# Patient Record
Sex: Female | Born: 1980 | Race: White | Hispanic: Yes | Marital: Married | Smoking: Never smoker
Health system: Southern US, Community
[De-identification: ages and names within clinical notes are randomized; demographics above are authoritative.]

## PROBLEM LIST (undated history)

## (undated) DIAGNOSIS — R7303 Prediabetes: Secondary | ICD-10-CM

## (undated) DIAGNOSIS — T7840XA Allergy, unspecified, initial encounter: Secondary | ICD-10-CM

## (undated) DIAGNOSIS — M3500 Sicca syndrome, unspecified: Secondary | ICD-10-CM

## (undated) DIAGNOSIS — D649 Anemia, unspecified: Secondary | ICD-10-CM

## (undated) DIAGNOSIS — M069 Rheumatoid arthritis, unspecified: Secondary | ICD-10-CM

## (undated) HISTORY — DX: Anemia, unspecified: D64.9

## (undated) HISTORY — DX: Prediabetes: R73.03

## (undated) HISTORY — DX: Allergy, unspecified, initial encounter: T78.40XA

## (undated) HISTORY — DX: Rheumatoid arthritis, unspecified: M06.9

## (undated) HISTORY — DX: Sicca syndrome, unspecified: M35.00

---
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
## 2002-05-26 ENCOUNTER — Encounter (INDEPENDENT_AMBULATORY_CARE_PROVIDER_SITE_OTHER): Payer: Self-pay | Admitting: Specialist

## 2002-05-26 ENCOUNTER — Ambulatory Visit (HOSPITAL_COMMUNITY): Admission: AD | Admit: 2002-05-26 | Discharge: 2002-05-26 | Payer: Self-pay | Admitting: *Deleted

## 2003-04-13 ENCOUNTER — Emergency Department (HOSPITAL_COMMUNITY): Admission: EM | Admit: 2003-04-13 | Discharge: 2003-04-14 | Payer: Self-pay | Admitting: *Deleted

## 2003-08-21 ENCOUNTER — Other Ambulatory Visit: Admission: RE | Admit: 2003-08-21 | Discharge: 2003-08-21 | Payer: Self-pay | Admitting: Obstetrics & Gynecology

## 2004-02-11 ENCOUNTER — Ambulatory Visit: Payer: Self-pay | Admitting: Family Medicine

## 2004-02-11 ENCOUNTER — Inpatient Hospital Stay (HOSPITAL_COMMUNITY): Admission: AD | Admit: 2004-02-11 | Discharge: 2004-02-11 | Payer: Self-pay | Admitting: *Deleted

## 2004-02-25 ENCOUNTER — Inpatient Hospital Stay (HOSPITAL_COMMUNITY): Admission: AD | Admit: 2004-02-25 | Discharge: 2004-02-25 | Payer: Self-pay | Admitting: *Deleted

## 2004-02-28 ENCOUNTER — Inpatient Hospital Stay (HOSPITAL_COMMUNITY): Admission: AD | Admit: 2004-02-28 | Discharge: 2004-02-28 | Payer: Self-pay | Admitting: Obstetrics and Gynecology

## 2004-02-29 ENCOUNTER — Inpatient Hospital Stay (HOSPITAL_COMMUNITY): Admission: AD | Admit: 2004-02-29 | Discharge: 2004-03-03 | Payer: Self-pay | Admitting: Family Medicine

## 2005-01-14 ENCOUNTER — Inpatient Hospital Stay (HOSPITAL_COMMUNITY): Admission: AD | Admit: 2005-01-14 | Discharge: 2005-01-15 | Payer: Self-pay | Admitting: Obstetrics & Gynecology

## 2005-02-15 ENCOUNTER — Other Ambulatory Visit: Admission: RE | Admit: 2005-02-15 | Discharge: 2005-02-15 | Payer: Self-pay | Admitting: Obstetrics and Gynecology

## 2005-06-30 ENCOUNTER — Ambulatory Visit: Payer: Self-pay | Admitting: *Deleted

## 2005-06-30 ENCOUNTER — Inpatient Hospital Stay (HOSPITAL_COMMUNITY): Admission: AD | Admit: 2005-06-30 | Discharge: 2005-07-03 | Payer: Self-pay | Admitting: *Deleted

## 2005-06-30 ENCOUNTER — Ambulatory Visit: Payer: Self-pay | Admitting: Certified Nurse Midwife

## 2007-09-28 ENCOUNTER — Emergency Department (HOSPITAL_COMMUNITY): Admission: EM | Admit: 2007-09-28 | Discharge: 2007-09-28 | Payer: Self-pay | Admitting: Emergency Medicine

## 2007-09-30 ENCOUNTER — Emergency Department (HOSPITAL_COMMUNITY): Admission: EM | Admit: 2007-09-30 | Discharge: 2007-09-30 | Payer: Self-pay | Admitting: Emergency Medicine

## 2007-10-02 ENCOUNTER — Emergency Department (HOSPITAL_COMMUNITY): Admission: EM | Admit: 2007-10-02 | Discharge: 2007-10-02 | Payer: Self-pay | Admitting: Emergency Medicine

## 2007-11-05 ENCOUNTER — Emergency Department (HOSPITAL_COMMUNITY): Admission: EM | Admit: 2007-11-05 | Discharge: 2007-11-05 | Payer: Self-pay | Admitting: Emergency Medicine

## 2009-11-16 ENCOUNTER — Encounter (INDEPENDENT_AMBULATORY_CARE_PROVIDER_SITE_OTHER): Payer: Self-pay | Admitting: Family Medicine

## 2009-11-16 ENCOUNTER — Ambulatory Visit: Payer: Self-pay | Admitting: Internal Medicine

## 2009-11-16 LAB — CONVERTED CEMR LAB
Alkaline Phosphatase: 69 units/L (ref 39–117)
Anti Nuclear Antibody(ANA): NEGATIVE
Basophils Absolute: 0 10*3/uL (ref 0.0–0.1)
Basophils Relative: 0 % (ref 0–1)
CO2: 21 meq/L (ref 19–32)
CRP: 0.2 mg/dL (ref <0.6)
Glucose, Bld: 89 mg/dL (ref 70–99)
HCT: 36.5 % (ref 36.0–46.0)
Monocytes Absolute: 0.5 10*3/uL (ref 0.1–1.0)
Monocytes Relative: 5 % (ref 3–12)
Neutro Abs: 6 10*3/uL (ref 1.7–7.7)
Neutrophils Relative %: 64 % (ref 43–77)
Platelets: 290 10*3/uL (ref 150–400)
RBC: 4.33 M/uL (ref 3.87–5.11)
RDW: 14.1 % (ref 11.5–15.5)
Rhuematoid fact SerPl-aCnc: 121 intl units/mL — ABNORMAL HIGH (ref 0–20)
Total Protein: 7.5 g/dL (ref 6.0–8.3)
Vit D, 25-Hydroxy: 23 ng/mL — ABNORMAL LOW (ref 30–89)

## 2009-12-29 ENCOUNTER — Other Ambulatory Visit: Admission: RE | Admit: 2009-12-29 | Discharge: 2009-12-29 | Payer: Self-pay | Admitting: Family Medicine

## 2009-12-29 ENCOUNTER — Ambulatory Visit: Payer: Self-pay | Admitting: Internal Medicine

## 2009-12-29 ENCOUNTER — Encounter (INDEPENDENT_AMBULATORY_CARE_PROVIDER_SITE_OTHER): Payer: Self-pay | Admitting: Family Medicine

## 2009-12-29 LAB — CONVERTED CEMR LAB
Cyclic Citrullin Peptide Ab: 2.2 units (ref 0.0–5.0)
Iron: 20 ug/dL — ABNORMAL LOW (ref 42–145)
Rhuematoid fact SerPl-aCnc: 89 intl units/mL — ABNORMAL HIGH (ref 0–20)
Saturation Ratios: 5 % — ABNORMAL LOW (ref 20–55)
Sed Rate: 18 mm/hr (ref 0–22)
TIBC: 370 ug/dL (ref 250–470)

## 2010-01-04 ENCOUNTER — Ambulatory Visit (HOSPITAL_COMMUNITY): Admission: RE | Admit: 2010-01-04 | Discharge: 2010-01-04 | Payer: Self-pay | Admitting: Internal Medicine

## 2010-10-28 NOTE — Op Note (Signed)
   NAMEJUNELLA, DOMKE                         ACCOUNT NO.:  [id number]   MEDICAL RECORD NO.:  [id number]                   PATIENT TYPE:  AMB   LOCATION:  SDC                                  FACILITY:  [facility]   PHYSICIAN:  Mary Sella. Orlene Erm, M.D.                 DATE OF BIRTH:  1980-12-09   DATE OF PROCEDURE:  05/26/2002  DATE OF DISCHARGE:                                 OPERATIVE REPORT   PREOPERATIVE DIAGNOSIS:  [age]-year-old gravida 2, para 1 at 10 weeks by  last menstrual period with missed abortion.   POSTOPERATIVE DIAGNOSIS:  [age]-year-old gravida 2, para 1 at 10 weeks by  last menstrual period with missed abortion.   PROCEDURE:  Suction dilatation and evacuation.   SURGEON:  Mary Sella. Orlene Erm, M.D.   ANESTHESIA:  MAC with local.   ESTIMATED BLOOD LOSS:  Minimal.   DISPOSITION:  To recovery room stable.   DESCRIPTION OF PROCEDURE:  The patient was taken to the operating room where  she was given MAC anesthesia.  She was prepped and draped in the sterile  fashion.  A speculum was placed in the vagina and the anterior lip of the  cervix was grasped with a sterile fashion.  A speculum was placed in the  vagina, and the anterior lip of the cervix was grasped with a single-tooth  tenaculum.  Approximately 10 cc of 1% lidocaine with epinephrine was used to  perform the paracervical block.  The cervix was dilated to a size 29 [place]  with [employer] dilators.  The 9 mm suction curet was introduced into the  intrauterine cavity and products of conception were removed.  Sharp  curettage was gently performed and the suction curet was reintroduced to  remove remaining products of conception.  The patient tolerated the  procedure well and was awakened and taken to the recovery room in stable  condition.                                               Mary Sella. Orlene Erm, M.D.    EMH/MEDQ  D:  05/26/2002  T:  05/26/2002  Job:  [id number]

## 2011-03-07 LAB — CBC
Hemoglobin: 13.1
MCV: 85.4
Platelets: 236
WBC: 10.5

## 2011-03-07 LAB — DIFFERENTIAL
Basophils Absolute: 0
Eosinophils Absolute: 0.1
Eosinophils Relative: 1
Lymphocytes Relative: 17
Monocytes Absolute: 0.6
Monocytes Relative: 5

## 2011-03-07 LAB — BASIC METABOLIC PANEL
Creatinine, Ser: 0.52
GFR calc Af Amer: >60
GFR calc non Af Amer: >60
Potassium: 3.6
Sodium: 135

## 2011-03-07 LAB — URINALYSIS, ROUTINE W REFLEX MICROSCOPIC
Bilirubin Urine: NEGATIVE
Hgb urine dipstick: NEGATIVE
Nitrite: NEGATIVE
Specific Gravity, Urine: 1.015

## 2011-08-12 IMAGING — CR DG CERVICAL SPINE COMPLETE 4+V
5 series · 5 of 5 positions shown · non-contrast
Comparison: None.

CLINICAL DATA: Neck pain; rheumatoid arthritis

CERVICAL SPINE - COMPLETE 4+ VIEW

[view not recorded (1 of 5)]
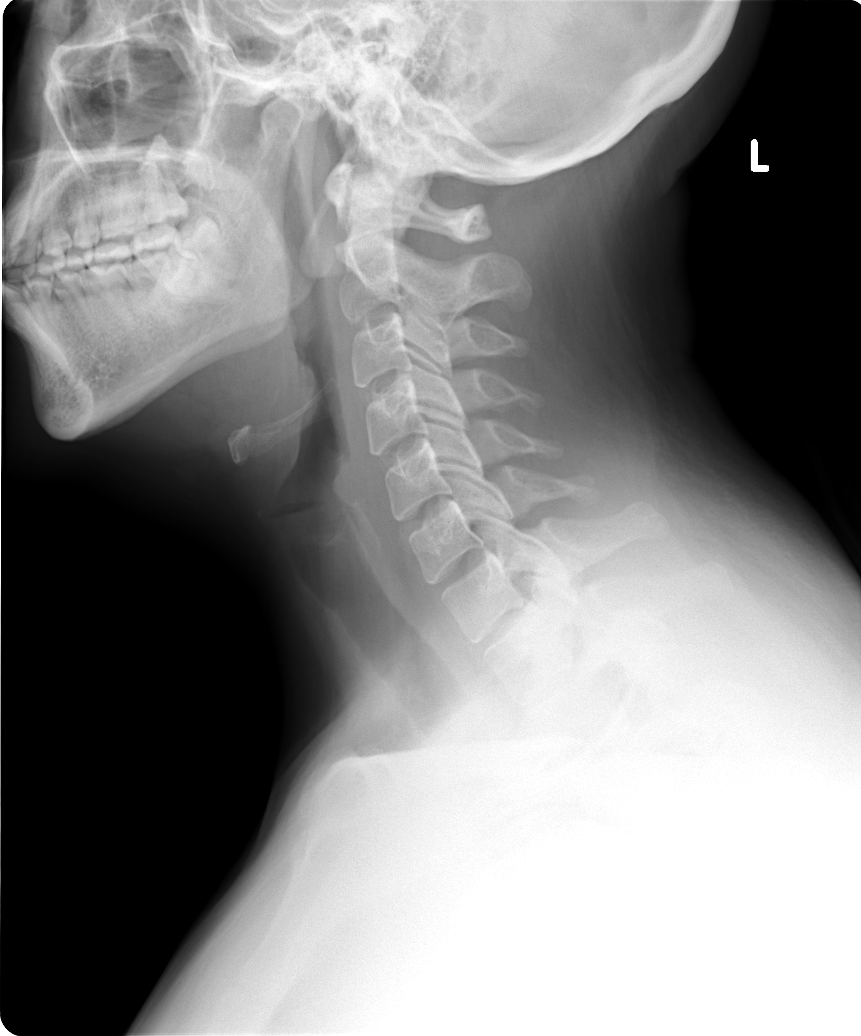

[view not recorded (2 of 5)]
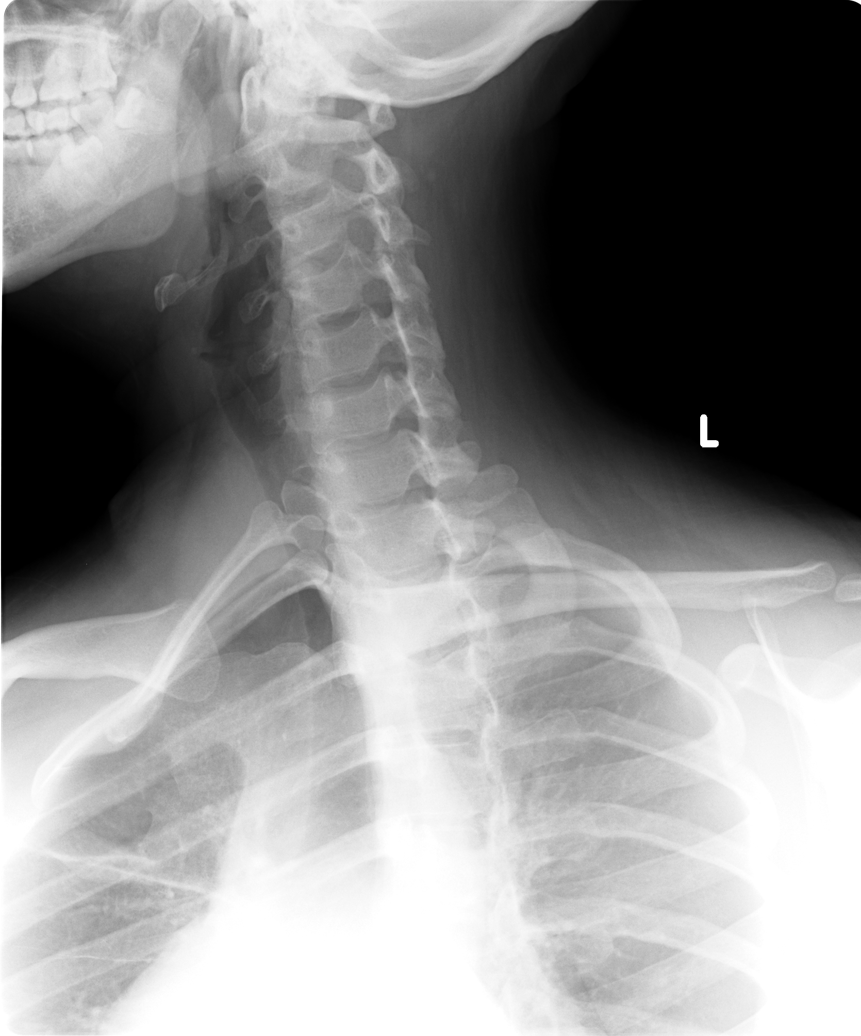

[view not recorded (3 of 5)]
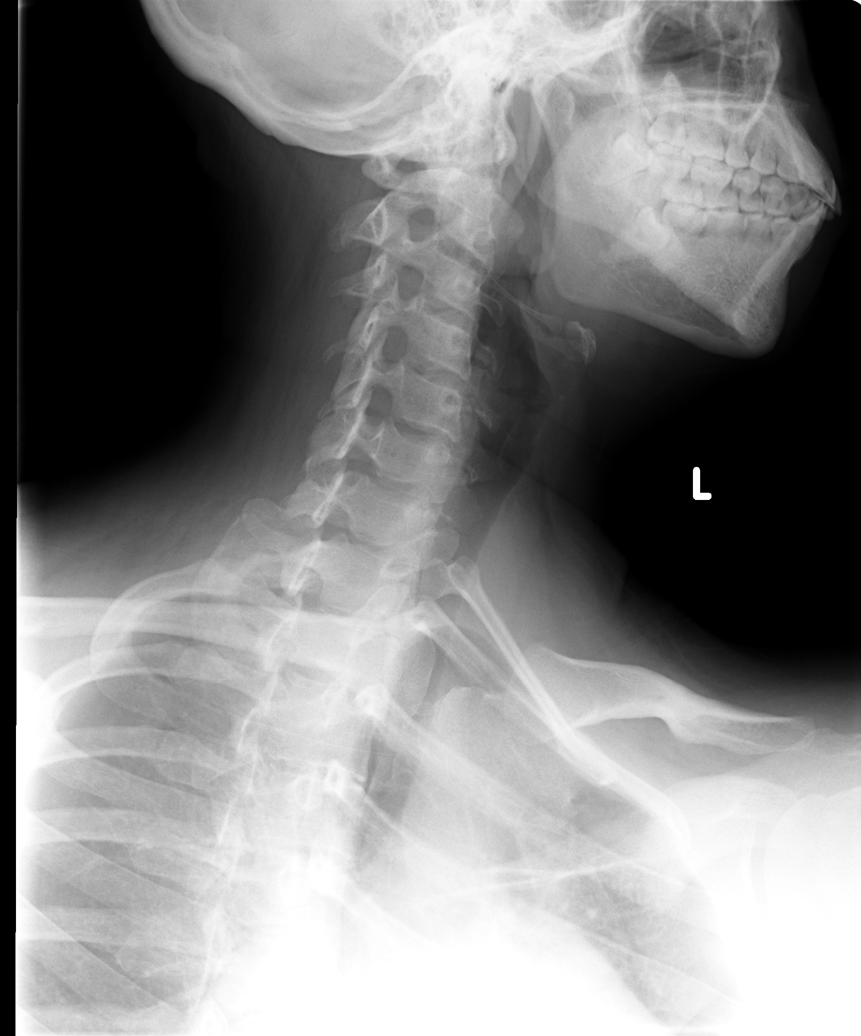

[view not recorded (4 of 5)]
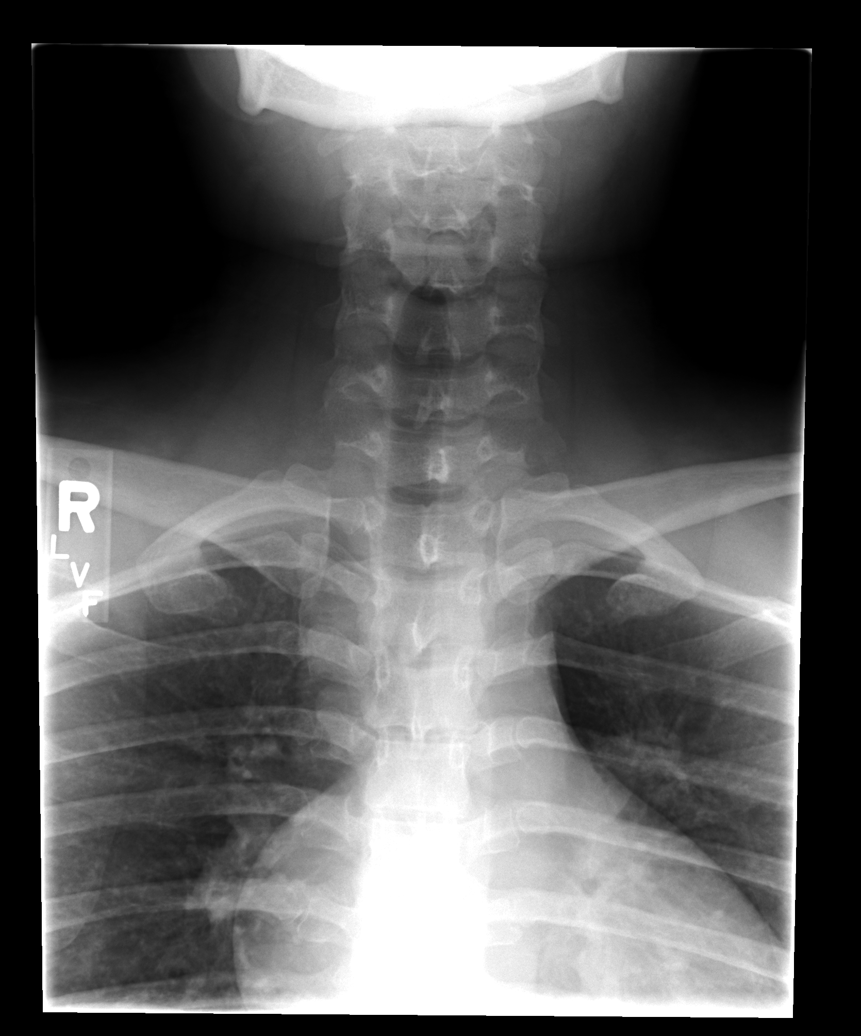

[view not recorded (5 of 5)]
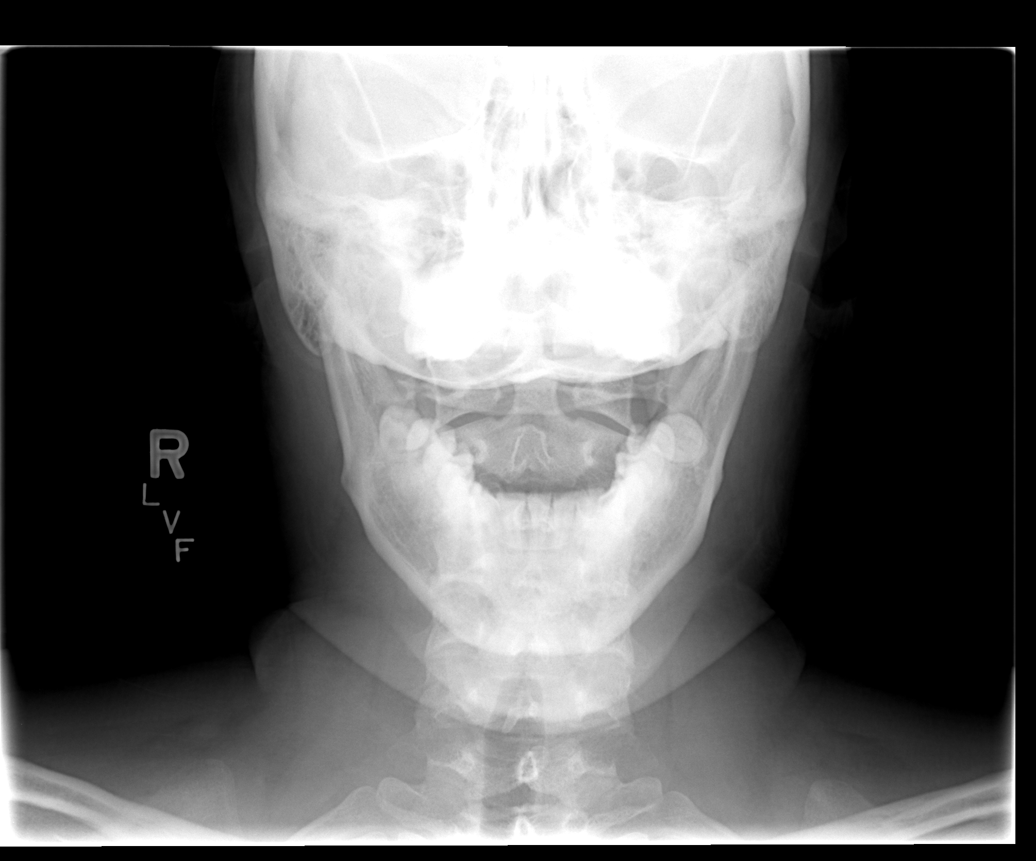

[5 of 5 positions shown; findings below may reference images not displayed]

FINDINGS: The odontoid is intact, and the lateral masses are well-
aligned.  The AP and lateral cervical alignment are within normal
limits.  The prevertebral soft tissue stripe is normal.  The
oblique views reveal no evidence of gross osseous encroachment upon
the neural foramina.
IMPRESSION: Negative cervical spine series.

## 2011-08-12 IMAGING — CR DG HAND COMPLETE 3+V*L*
3 series · 3 of 3 positions shown · non-contrast
Comparison: None.

CLINICAL DATA: Joint pain, rheumatoid arthritis

LEFT HAND - COMPLETE 3+ VIEW

[view not recorded (1 of 3)]
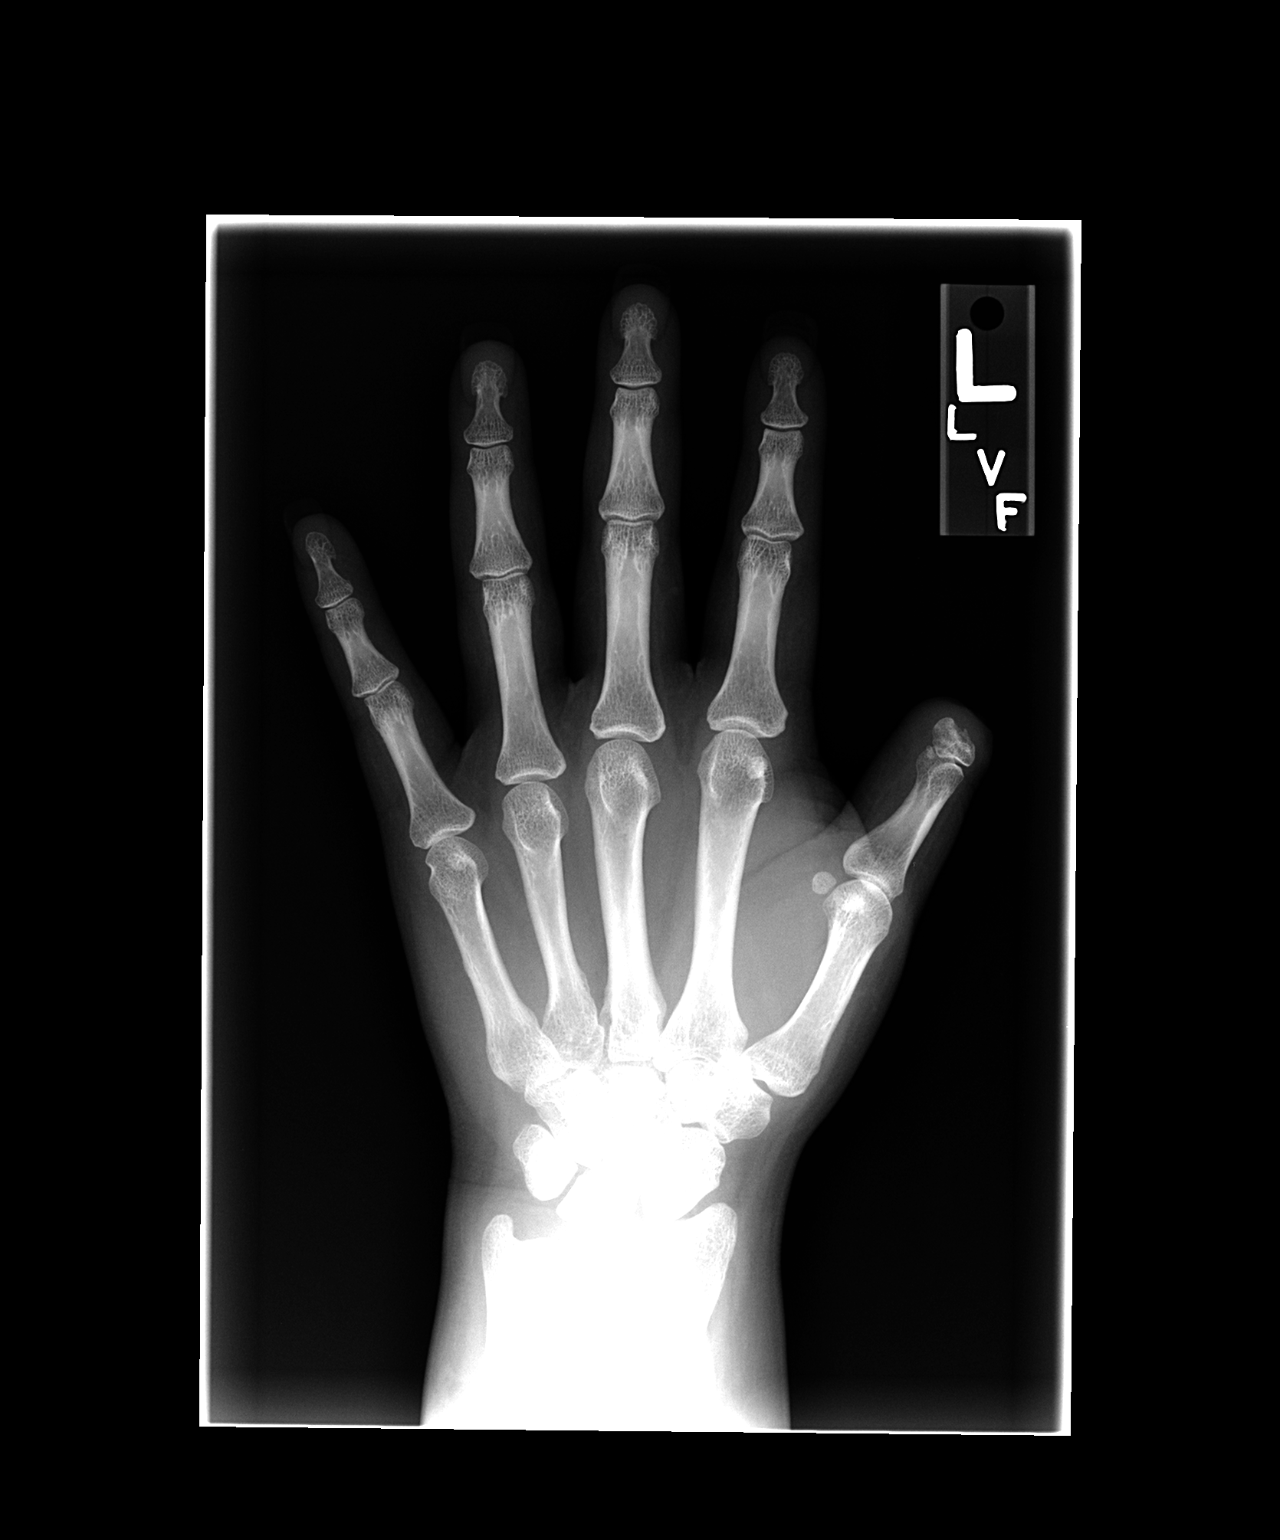

[view not recorded (2 of 3)]
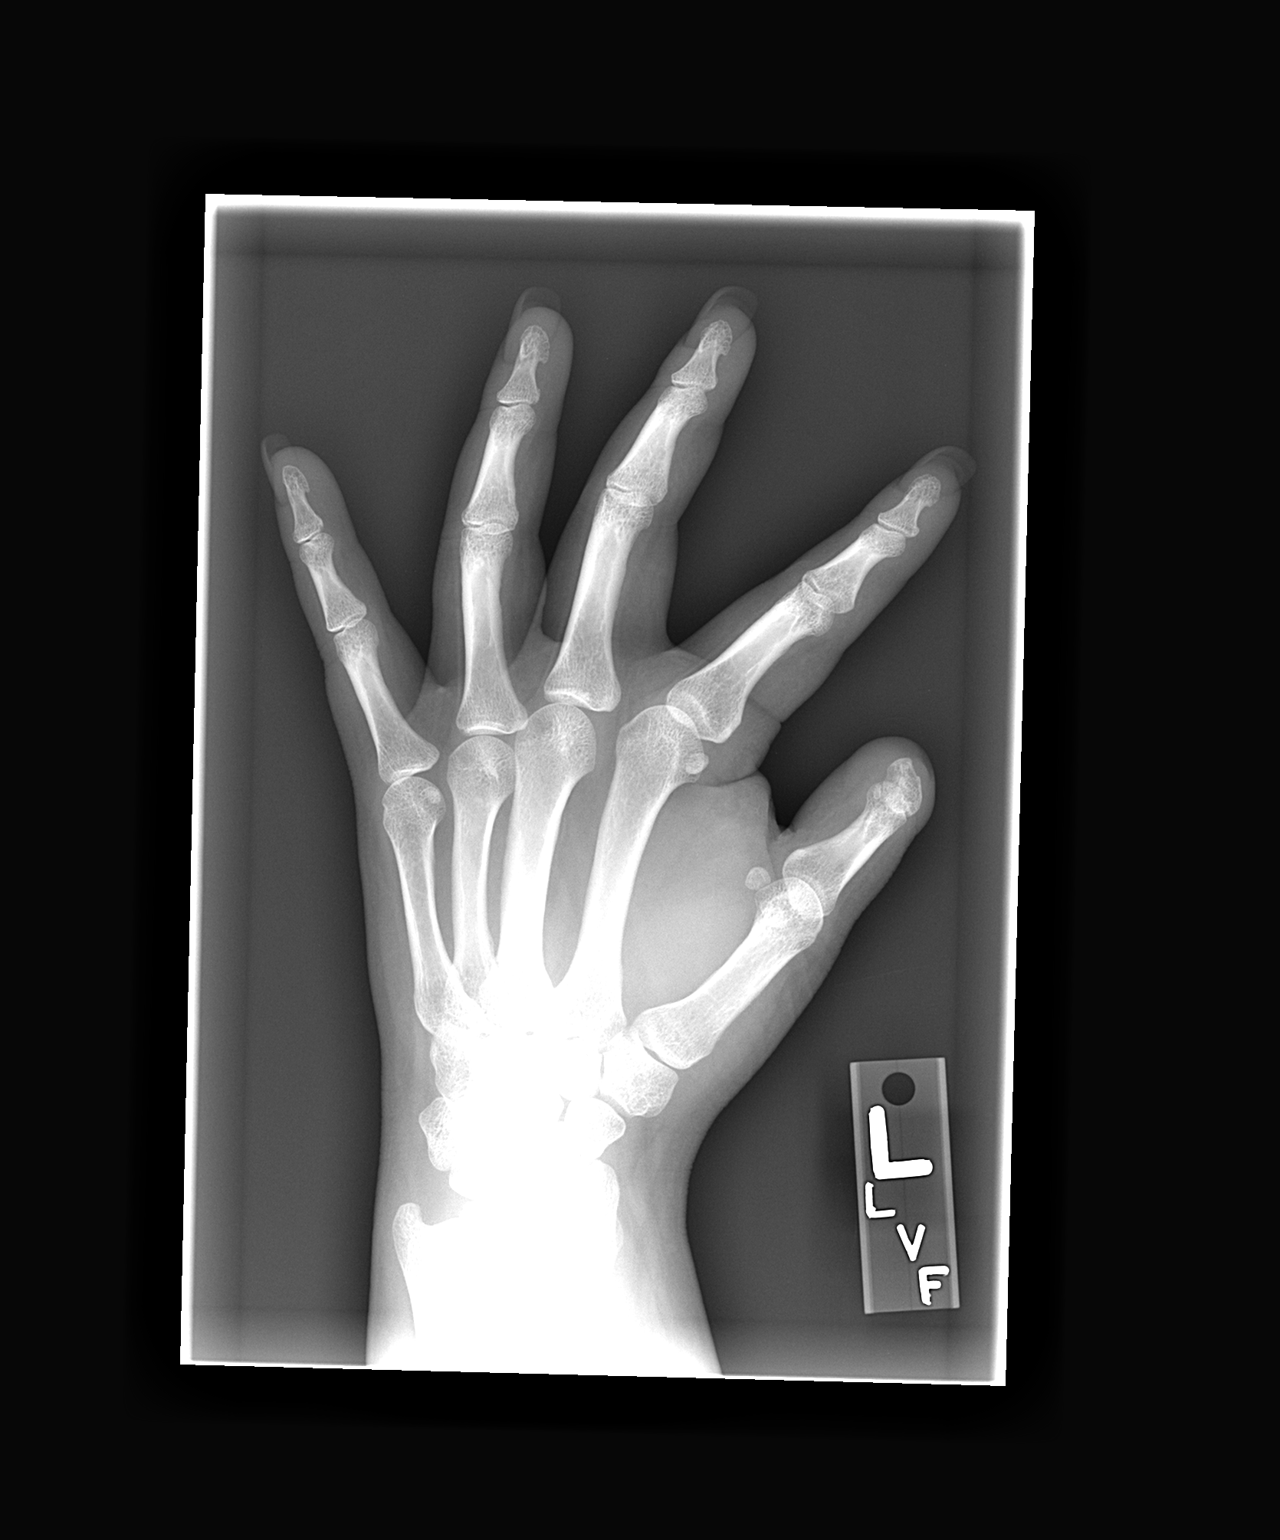

[view not recorded (3 of 3)]
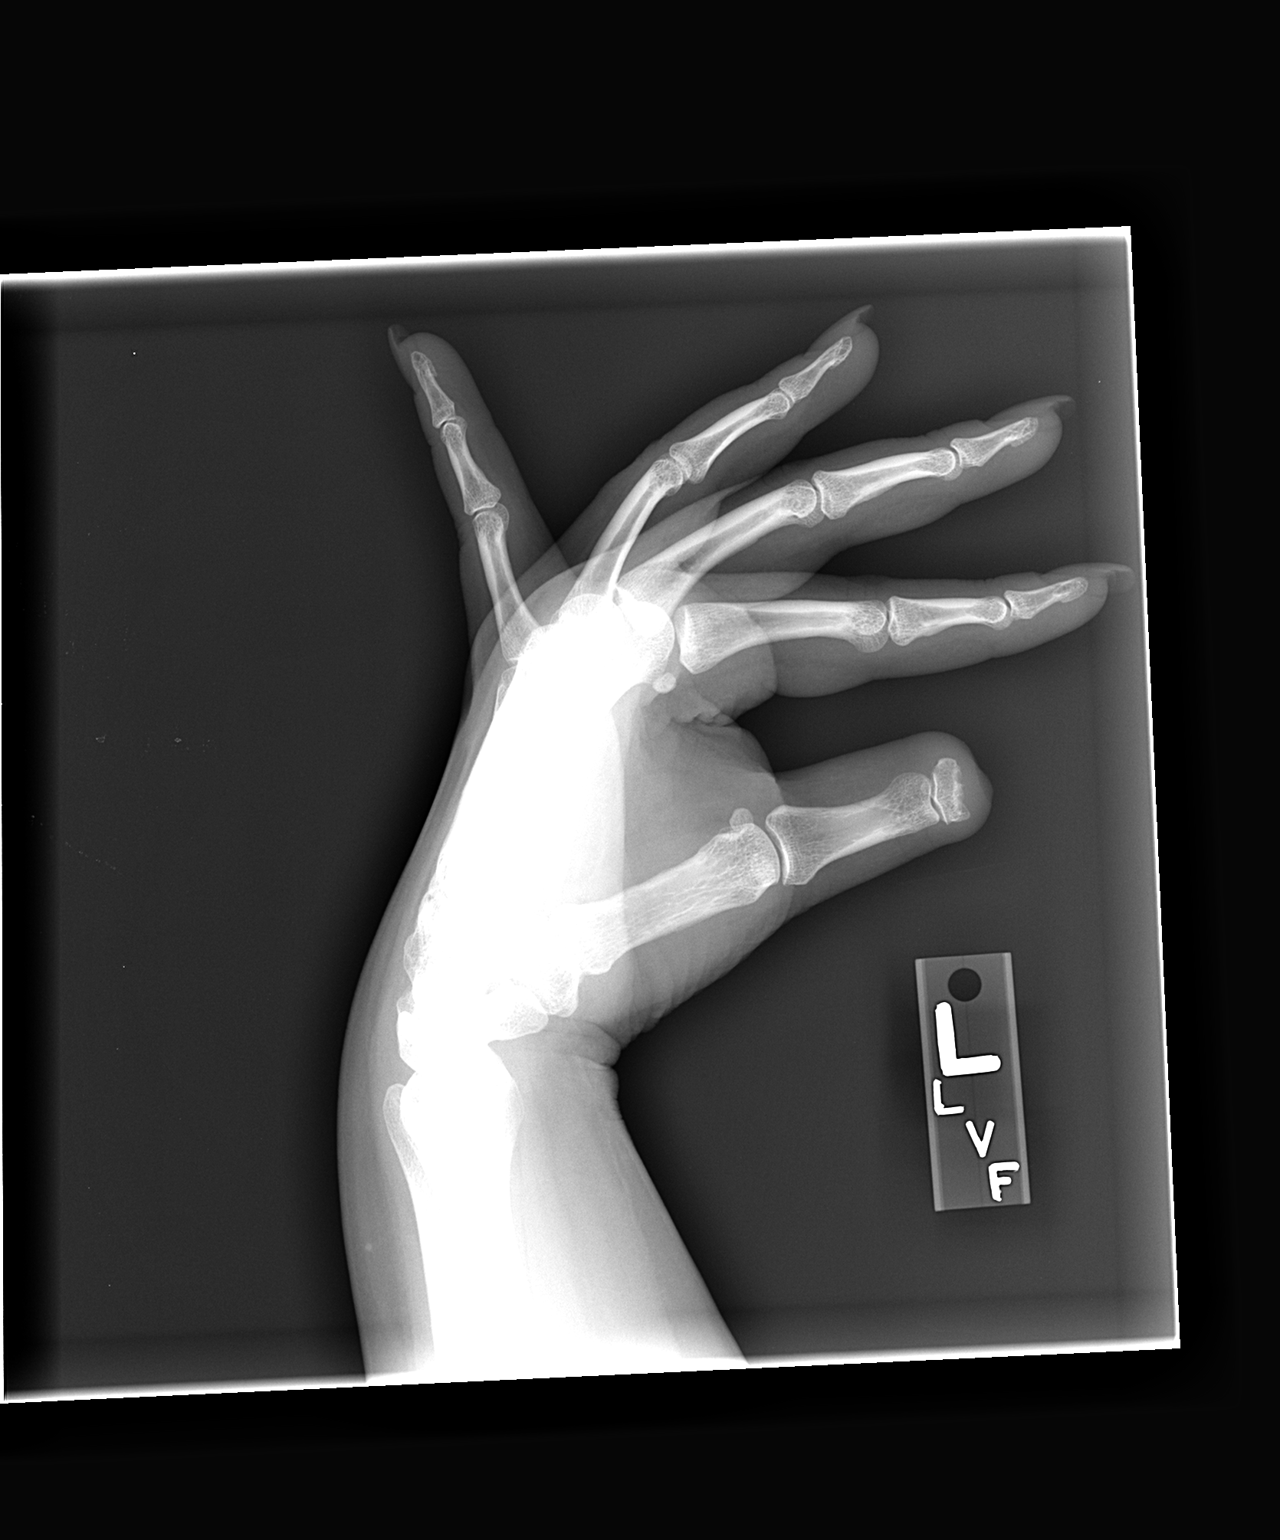

[3 of 3 positions shown; findings below may reference images not displayed]

FINDINGS: There is no evidence of joint compartment narrowing,
erosions, or hypertrophic changes.  No periarticular osteopenia is
noted.  There is amputation of the first distal phalanx.
IMPRESSION: No gross joint disease.

## 2012-11-05 DIAGNOSIS — M069 Rheumatoid arthritis, unspecified: Secondary | ICD-10-CM

## 2012-11-05 HISTORY — DX: Rheumatoid arthritis, unspecified: M06.9

## 2014-06-12 DIAGNOSIS — D649 Anemia, unspecified: Secondary | ICD-10-CM

## 2014-06-12 HISTORY — DX: Anemia, unspecified: D64.9

## 2015-03-24 ENCOUNTER — Ambulatory Visit (INDEPENDENT_AMBULATORY_CARE_PROVIDER_SITE_OTHER): Payer: Self-pay | Admitting: Internal Medicine

## 2015-03-24 ENCOUNTER — Encounter: Payer: Self-pay | Admitting: Internal Medicine

## 2015-03-24 VITALS — BP 124/82 | HR 74 | Ht 68.0 in | Wt 222.0 lb

## 2015-03-24 DIAGNOSIS — R748 Abnormal levels of other serum enzymes: Secondary | ICD-10-CM

## 2015-03-24 DIAGNOSIS — R635 Abnormal weight gain: Secondary | ICD-10-CM

## 2015-03-24 NOTE — Progress Notes (Signed)
   Subjective:    Patient ID: Kathy Curry, female    DOB: September 19, 1980, 34 y.o.   MRN: [id number]  HPI  Einar GradDulce here in follow up for [facility] for possible RA/Inflammatory Arthritis and Allergies, with symptoms of rash and swelling of tongue she has felt is related to eating meat, though had had symptoms without eating meat. Work up has been delayed by difficulties with patient follow up.  1.  Early RA?:  Seen by Dr. Annita BrodAlfredo Rivadeneira in August.   Pt. Has been initiated on Hydroxychloroquine and Meloxicam, though not clear whether Rheumatology or Allergy started the Hydroxycholoquine after further review.     2.  Allergies:    Seen by Dr. Bennie HindSaira Sheikh in September.  Allergy testing with skin prick testing to standard environmental panel as well as select foods was negative.  Intradermal testing for 16 environmental allergens was negative except for dust mites.  Because of her sense of allergies to meats, she was tested for alpha gal, which returned negative.   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 Borderline high for G6PD at 10.5 (high is above 10.3) Decision made to control allergic symptoms with long acting antihistamine--not clear patient is taking.  3.  Menorrhagia:  Pt. Found to have microcytic anemia and is having prolonged heavy periods as well.  [facility]  4.  Low Vitamin D:  Found in Rheum work up as well.  Being replaced.  5.  Obesity:  Pt. Continues to feel she is eating healthily and is very physically active.  Recent TSH WNL.  Describes healthy diet.  BMI remains above 30.    Review of Systems     Objective:   Physical Exam  HEENT:  PERRL, EOMI, throat without injection Skin:   Acanthosis nigricans:  Neck, axilla, groin, antecubital fossa. Neck:  Supple, no adenopathy or thyromegaly.   Chest:  CTA CV:  RRR with normal S1 and S2, No S3, S4, or murmur appreciated.  No LE edema Abd:  S, NT, No  HSM or masses appreciated.      Assessment & Plan:  1.  OBesity with Acanthosis Nigricans:  Concern for DM or prediabetes.  Check A1C. Long discussion again regarding regular physical activity and well rounded, healty diet.  2.  Elevated hepatic transaminases--[facility]:  Chronic Hepatitis [facility] and [facility].  Will see what A1C shows and likely have her return for FLP and CMP--suspect fatty liver.

## 2015-03-26 ENCOUNTER — Other Ambulatory Visit (INDEPENDENT_AMBULATORY_CARE_PROVIDER_SITE_OTHER): Payer: Self-pay | Admitting: Internal Medicine

## 2015-03-26 DIAGNOSIS — L83 Acanthosis nigricans: Secondary | ICD-10-CM

## 2015-03-27 LAB — LIPID PANEL
Chol/HDL Ratio: 5.2 ratio units — ABNORMAL HIGH (ref 0.0–4.4)
Cholesterol, Total: 194 mg/dL (ref 100–199)
HDL: 37 mg/dL — ABNORMAL LOW (ref >39)
LDL Calculated: 129 mg/dL — ABNORMAL HIGH (ref 0–99)
Triglycerides: 141 mg/dL (ref 0–149)
VLDL Cholesterol Cal: 28 mg/dL (ref 5–40)

## 2015-03-27 LAB — HEMOGLOBIN A1C
ESTIMATED AVERAGE GLUCOSE: 131 mg/dL
HEMOGLOBIN A1C: 6.2 % — AB (ref 4.8–5.6)

## 2015-03-31 ENCOUNTER — Ambulatory Visit (INDEPENDENT_AMBULATORY_CARE_PROVIDER_SITE_OTHER): Payer: Self-pay | Admitting: Internal Medicine

## 2015-03-31 ENCOUNTER — Encounter: Payer: Self-pay | Admitting: Internal Medicine

## 2015-03-31 VITALS — BP 118/78 | HR 72 | Ht 68.0 in | Wt 222.0 lb

## 2015-03-31 DIAGNOSIS — L720 Epidermal cyst: Secondary | ICD-10-CM

## 2015-03-31 DIAGNOSIS — E669 Obesity, unspecified: Secondary | ICD-10-CM | POA: Insufficient documentation

## 2015-03-31 DIAGNOSIS — R739 Hyperglycemia, unspecified: Secondary | ICD-10-CM

## 2015-03-31 DIAGNOSIS — Z79899 Other long term (current) drug therapy: Secondary | ICD-10-CM

## 2015-03-31 MED ORDER — METFORMIN HCL 500 MG PO TABS: 500.0000 mg | ORAL_TABLET | Freq: Two times a day (BID) | ORAL | Status: DC

## 2015-03-31 NOTE — Patient Instructions (Signed)
Drink a glass of water before every meal Drink 6-8 glasses of water daily Eat three meals daily Eat a protein and healthy fat with every meal (eggs,fish, chicken, turkey and limit red meats) Eat 5 servings of vegetables daily, mix the colors Eat 2 servings of fruit daily with skin, if skin is edible Use smaller plates Put food/utensils down as you chew and swallow each bite Eat at a table with friends/family at least once daily, no TV Do not eat in front of the TV 

## 2015-03-31 NOTE — Progress Notes (Signed)
   Subjective:    Patient ID: Kathy Curry, female    DOB: [birth year], 34 y.o.   MRN: [id number]  HPI  1.  Hyperglycemia, weight gain and A1C of 6.2%:  Discussed insulin resistance and continue work with diet and increasing physical activity.  Pt. Describes a healthy diet and is cycling regularly.   2. Dyslipidemia:  Diet and physical activity to increase HDL.  Total cholesterol just under 200, Trigs borderline as well.  LDL in low 100s.  HDL 30s.    Review of Systems     Objective:   Physical Exam  Pt. Had me look at an cyst in left groin-1.5 cm, axis in fold of groin.  No erythema or tenderness.  Not fixed.       Assessment & Plan:  1. HYperglycemia with insulin resistance:  Start Metformin.  Side effects and benefits discussed and pt. Prefers giving it a try now. Check TSH/CMP 2.  Obesity :  As above. 3.  Epidermoid cyst, left groin:  No concern.  Discussed possibility of infection.

## 2015-04-01 LAB — COMPREHENSIVE METABOLIC PANEL
A/G RATIO: 1.5 (ref 1.1–2.5)
ALT: 72 IU/L — AB (ref 0–32)
AST: 43 IU/L — AB (ref 0–40)
Albumin: 4.3 g/dL (ref 3.5–5.5)
Alkaline Phosphatase: 60 IU/L (ref 39–117)
BILIRUBIN TOTAL: 0.2 mg/dL (ref 0.0–1.2)
BUN/Creatinine Ratio: 11 (ref 8–20)
BUN: 6 mg/dL (ref 6–20)
CALCIUM: 9.7 mg/dL (ref 8.7–10.2)
CHLORIDE: 102 mmol/L (ref 97–106)
CO2: 23 mmol/L (ref 18–29)
Creatinine, Ser: 0.53 mg/dL — ABNORMAL LOW (ref 0.57–1.00)
GFR calc Af Amer: 144 mL/min/{1.73_m2} (ref >59)
GFR calc non Af Amer: 125 mL/min/{1.73_m2} (ref >59)
GLOBULIN, TOTAL: 2.9 g/dL (ref 1.5–4.5)
Glucose: 81 mg/dL (ref 65–99)
POTASSIUM: 4.4 mmol/L (ref 3.5–5.2)
Sodium: 140 mmol/L (ref 136–144)
Total Protein: 7.2 g/dL (ref 6.0–8.5)

## 2015-04-01 LAB — TSH: TSH: 1.45 u[IU]/mL (ref 0.450–4.500)

## 2015-04-13 ENCOUNTER — Encounter: Payer: Self-pay | Admitting: Internal Medicine

## 2015-04-13 DIAGNOSIS — R748 Abnormal levels of other serum enzymes: Secondary | ICD-10-CM | POA: Insufficient documentation

## 2015-04-13 DIAGNOSIS — M1990 Unspecified osteoarthritis, unspecified site: Secondary | ICD-10-CM | POA: Insufficient documentation

## 2015-05-11 DIAGNOSIS — G894 Chronic pain syndrome: Secondary | ICD-10-CM

## 2015-06-09 ENCOUNTER — Ambulatory Visit (INDEPENDENT_AMBULATORY_CARE_PROVIDER_SITE_OTHER): Payer: Self-pay | Admitting: Internal Medicine

## 2015-06-09 VITALS — BP 120/80 | HR 76 | Ht 62.5 in | Wt 219.5 lb

## 2015-06-09 DIAGNOSIS — R1032 Left lower quadrant pain: Secondary | ICD-10-CM

## 2015-06-09 DIAGNOSIS — Z79899 Other long term (current) drug therapy: Secondary | ICD-10-CM

## 2015-06-09 DIAGNOSIS — R1031 Right lower quadrant pain: Secondary | ICD-10-CM

## 2015-06-09 LAB — POCT URINALYSIS DIPSTICK
Bilirubin, UA: NEGATIVE
Glucose, UA: NEGATIVE
KETONES UA: 5
Nitrite, UA: NEGATIVE
PROTEIN UA: 15
SPEC GRAV UA: 1.025
Urobilinogen, UA: NEGATIVE
pH, UA: 5

## 2015-06-09 MED ORDER — SULFAMETHOXAZOLE-TRIMETHOPRIM 800-160 MG PO TABS: 1.0000 | ORAL_TABLET | Freq: Two times a day (BID) | ORAL | Status: DC

## 2015-06-09 NOTE — Patient Instructions (Signed)
Check to see if have appt. Around 06/30/2014 with me for follow up of prediabetes Drink lots and lots of water to flush bladder Call if no better after 48 hours of antibioticsw

## 2015-06-09 NOTE — Progress Notes (Signed)
   Subjective:    Patient ID: Kathy Curry, female    DOB: Apr 08, 1981, 34 y.o.   MRN: [id number] th eyes.    HPI  1.  Bilateral lower pelvic pain started about 1 week ago.  Yesterday, pain became more intense and radiated to low back.  Describes pain as burning in anterior pelvic area.  Back pain feels like cramping/labor pains.  No pain up into flank. Some increased pain in the groin with urination, but no suprapubic pain.  Has urinary urge, but often with no urine output.  No fever or hematuria.  BMs are normal and no increased pain with these.  No diarrhea or nausea.  Is drinking a lot of water.   No vaginal discharge  2.  Early Rheumatoid Arthritis:  Is on Plaquenil and has not been able to clarify the [facility] aspects [facility] so has not had baseline eye exam.    3.  Oral sores and tongue swelling with eyes:  Since on Plaquenil, not having urticaria, but continues to have intermittent oral symptoms, though less frequent.  Not clealyr associated with any exposure at this point.  Again, still working on the financial aspects/charity care program.  Did not get started on long acting antihistamine as [facility]--just seeing how she does with Plaquenil.  Somehow, documented back in October 12 that patient 5'8".  She is not.  Current height is correct.      Review of Systems     Objective:   Physical Exam NAD LUngs:  CTA CV:  RRR without murmur or rub Back:  No CVA tenderness ABd:  S, Tenderness more so in bilateral lower quadrants and minimally so in suprapubic area.       Assessment & Plan:  1.  Probable UTI:  UA supports.  Send urine for culture.  TMP/SMX DS 1 tab po bid for 10 day course with concern developing upper urinary tract infection at this point. To call if no better in 48 hours.  Push fluids.  2.  Chronic use of Plaquenil for likely early RA and for her recurrent urticaria and oral swelling:  Send for Ophthalmology  referral--will see where she gets in more quickly. [facility] is other possibility

## 2015-06-11 LAB — URINE CULTURE

## 2015-06-11 MED ORDER — CIPROFLOXACIN HCL 500 MG PO TABS: 500.0000 mg | ORAL_TABLET | Freq: Two times a day (BID) | ORAL | Status: DC

## 2015-06-15 ENCOUNTER — Encounter: Payer: Self-pay | Admitting: Internal Medicine

## 2015-06-18 NOTE — Congregational Nurse Program (Signed)
Congregational Nurse Program Note  Date of Encounter: 05/11/2015  Past Medical History: No past medical history on file.  Encounter Details:     CNP Questionnaire - 06/01/15 1146    Patient Demographics   Is this a new or existing patient? New   Patient is considered a/an Immigrant   Race Latino/Hispanic   Patient Assistance   Location of Patient Assistance [place]   Patient's financial/insurance status Self-Pay   Uninsured Patient Yes   Interventions Counseled to make appt. with provider   Patient referred to apply for the following financial assistance Not Applicable   Food insecurities addressed Not Applicable   Transportation assistance No   Assistance securing medications No   Educational health offerings Chronic disease   Encounter Details   Primary purpose of visit Acute Illness/Condition Visit   Was an Emergency Department visit averted? Not Applicable   Does patient have a medical provider? Yes   Patient referred to Follow up with established PCP   Was a mental health screening completed? (GAINS tool) No   Does patient have dental issues? No   Since previous encounter, have you referred patient for abnormal blood pressure that resulted in a new diagnosis or medication change? No   Since previous encounter, have you referred patient for abnormal blood glucose that resulted in a new diagnosis or medication change? No       Pt. Complains of pain in her r. Shoulder and wrists. Has rheumatoid arthritis and forgot to take her medications this am.  Asks for a pain reliever.  RN provided ibuprofen from 1st Aid Kit kit.

## 2015-06-30 ENCOUNTER — Ambulatory Visit (INDEPENDENT_AMBULATORY_CARE_PROVIDER_SITE_OTHER): Payer: Self-pay | Admitting: Internal Medicine

## 2015-06-30 VITALS — BP 120/78 | HR 76 | Ht 62.5 in | Wt 220.5 lb

## 2015-06-30 DIAGNOSIS — R319 Hematuria, unspecified: Secondary | ICD-10-CM

## 2015-06-30 DIAGNOSIS — B373 Candidiasis of vulva and vagina: Secondary | ICD-10-CM

## 2015-06-30 DIAGNOSIS — N920 Excessive and frequent menstruation with regular cycle: Secondary | ICD-10-CM

## 2015-06-30 DIAGNOSIS — B3731 Acute candidiasis of vulva and vagina: Secondary | ICD-10-CM

## 2015-06-30 DIAGNOSIS — R739 Hyperglycemia, unspecified: Secondary | ICD-10-CM

## 2015-06-30 MED ORDER — FLUCONAZOLE 150 MG PO TABS: ORAL_TABLET | ORAL | Status: DC

## 2015-06-30 NOTE — Patient Instructions (Signed)
Restart physical activity daily
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
## 2015-07-01 LAB — POCT URINALYSIS DIPSTICK
BILIRUBIN UA: NEGATIVE
Blood, UA: NEGATIVE
GLUCOSE UA: NEGATIVE
Ketones, UA: NEGATIVE
LEUKOCYTES UA: NEGATIVE
NITRITE UA: NEGATIVE
PH UA: 6
Protein, UA: NEGATIVE
Spec Grav, UA: 1.01
Urobilinogen, UA: NEGATIVE

## 2015-07-01 LAB — HGB A1C W/O EAG: HEMOGLOBIN A1C: 6.1 % — AB (ref 4.8–5.6)

## 2015-09-03 ENCOUNTER — Encounter: Payer: Self-pay | Admitting: Internal Medicine

## 2015-09-06 ENCOUNTER — Telehealth: Payer: Self-pay | Admitting: Internal Medicine

## 2015-09-06 NOTE — Telephone Encounter (Signed)
Patient called to inform that she has referral to [facility] on May 1st at 1:00 p.m. To see Dr. Sherrye PayorJasmine John.  Patient needs Dr. Delrae AlfredMulberry to send information to Dr. Sherrye PayorJasmine John regarding her recommendation for patient care.  Dr. Sherrye PayorJasmine John [phone].

## 2015-09-16 DIAGNOSIS — M3500 Sicca syndrome, unspecified: Secondary | ICD-10-CM

## 2015-09-16 HISTORY — DX: Sjogren syndrome, unspecified: M35.00

## 2015-09-17 NOTE — Telephone Encounter (Signed)
Charity Care was accepted and patient will be seen by a Theatre managerGynecologist at  [facility] [place]. Dr. Danelle BerryJasmine Jones. Patient needs for Dr. Delrae AlfredMulberry to sent information to Dr. Leavy CellaJasmine regarding her recommendation for patient care

## 2015-09-22 NOTE — Telephone Encounter (Signed)
The address for the physician is -[facility]                                                        [address], [place] [id number]

## 2015-09-23 ENCOUNTER — Telehealth: Payer: Self-pay | Admitting: Internal Medicine

## 2015-09-27 NOTE — Telephone Encounter (Signed)
Last three visits and recommendation letter faxed to [phone] today

## 2015-10-29 ENCOUNTER — Encounter: Payer: Self-pay | Admitting: Internal Medicine

## 2015-10-29 ENCOUNTER — Ambulatory Visit (INDEPENDENT_AMBULATORY_CARE_PROVIDER_SITE_OTHER): Payer: Self-pay | Admitting: Internal Medicine

## 2015-10-29 VITALS — BP 118/62 | HR 66 | Resp 18 | Ht 62.5 in | Wt 216.0 lb

## 2015-10-29 DIAGNOSIS — R7303 Prediabetes: Secondary | ICD-10-CM

## 2015-10-29 DIAGNOSIS — M1990 Unspecified osteoarthritis, unspecified site: Secondary | ICD-10-CM

## 2015-10-29 DIAGNOSIS — Z658 Other specified problems related to psychosocial circumstances: Secondary | ICD-10-CM

## 2015-10-29 DIAGNOSIS — N920 Excessive and frequent menstruation with regular cycle: Secondary | ICD-10-CM
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
## 2015-11-09 DIAGNOSIS — Z658 Other specified problems related to psychosocial circumstances: Secondary | ICD-10-CM | POA: Insufficient documentation

## 2015-11-09 DIAGNOSIS — N920 Excessive and frequent menstruation with regular cycle: Secondary | ICD-10-CM | POA: Insufficient documentation

## 2015-11-09 DIAGNOSIS — R7303 Prediabetes: Secondary | ICD-10-CM | POA: Insufficient documentation

## 2015-11-09 HISTORY — DX: Prediabetes: R73.03

## 2015-12-08 ENCOUNTER — Other Ambulatory Visit: Payer: No Typology Code available for payment source | Admitting: Licensed Clinical Social Worker

## 2015-12-15 ENCOUNTER — Ambulatory Visit (INDEPENDENT_AMBULATORY_CARE_PROVIDER_SITE_OTHER): Payer: Self-pay | Admitting: Internal Medicine

## 2015-12-15 ENCOUNTER — Encounter: Payer: Self-pay | Admitting: Internal Medicine

## 2015-12-15 VITALS — BP 118/70 | HR 80 | Resp 18 | Ht 62.5 in | Wt 216.0 lb

## 2015-12-15 DIAGNOSIS — F439 Reaction to severe stress, unspecified: Secondary | ICD-10-CM

## 2015-12-15 DIAGNOSIS — Z658 Other specified problems related to psychosocial circumstances: Secondary | ICD-10-CM

## 2015-12-15 DIAGNOSIS — E669 Obesity, unspecified: Secondary | ICD-10-CM

## 2015-12-15 DIAGNOSIS — N924 Excessive bleeding in the premenopausal period: Secondary | ICD-10-CM

## 2015-12-15 DIAGNOSIS — R739 Hyperglycemia, unspecified: Secondary | ICD-10-CM

## 2015-12-15 LAB — GLUCOSE, POCT (MANUAL RESULT ENTRY): POC GLUCOSE: 89 mg/dL (ref 70–99)

## 2015-12-15 NOTE — Patient Instructions (Signed)
Ask if you might be a candidate for endometrial ablation.

## 2015-12-15 NOTE — Progress Notes (Signed)
   Subjective:    Patient ID: Kathy PacerDulce V Cottingham, female    DOB: August 17, 1980, 35 y.o.   MRN: [id number]  HPI   1.  Prediabetes:  Planning to join [employer], which is within walking distance of her job.   Having some issues with her husband and still trying to decide what to do about her concerns with her son and soccer episode (see previous note)   Has tried in many ways despite this, to be physically active, but not like she would like. Feels like she is doing well with diet.  High in vegetables, beans, fruits.    2.  Menorrhagia:  Had normal pap 10/11/2015.  Has tried OCPs, multiple and Depo provera, all of these caused weight gain or were not efficacious (pregnant on both patch and Depoprovera, and her husband developed penile irritation from the IUD strings.  Per patient, no discussion of endometrial ablation as a possibility with [facility] when seen in May.   Has upcoming follow up.   3.  Marital Concerns:  Pt. Tearful, discussing concerns with her marital relationship.  This just as the family is readying their oldest son for college and the stress and excitement surrounding such a family milestone.       Current outpatient prescriptions:  .  hydroxychloroquine (PLAQUENIL) 200 MG tablet, Take 200 mg by mouth 2 (two) times daily., Disp: , Rfl:  .  metFORMIN (GLUCOPHAGE) 500 MG tablet, Take 1 tablet (500 mg total) by mouth 2 (two) times daily with a meal., Disp: 60 tablet, Rfl: 11 .  fluconazole (DIFLUCAN) 150 MG tablet, 1 tab daily for 2 days (Patient not taking: Reported on 10/29/2015), Disp: 2 tablet, Rfl: 0 .  meloxicam (MOBIC) 7.5 MG tablet, Take 7.5 mg by mouth daily. Reported on 12/15/2015, Disp: , Rfl:  .  Vitamin D, Ergocalciferol, (DRISDOL) 50000 UNITS CAPS capsule, Take 50,000 Units by mouth once a week. Reported on 12/15/2015, Disp: , Rfl:    No Known Allergies    Review of Systems     Objective:   Physical Exam NAD Lungs:  CTA CV:  RRR without murmur or rub, radial pulses normal  and equal LE:  No edema       Assessment & Plan:  1.  Multiple stressors, including concerns with marital relationship.   Referral to Samul DadaN. Knight, LCSW for counseling  2.  Prediabetes:  Continues to work on lifestyle changes, though difficult with current stressor. As in #1.  Continue to try to get to increased physical activity for stress relief.  A1C today.  3.  Menorrhagia: [facility]. [facility].  Encouraged to ask about endometrial ablation as an option.  Follow up in 4 months

## 2015-12-16 LAB — HGB A1C W/O EAG: HEMOGLOBIN A1C: 5.6 % (ref 4.8–5.6)

## 2015-12-29 ENCOUNTER — Ambulatory Visit (INDEPENDENT_AMBULATORY_CARE_PROVIDER_SITE_OTHER): Payer: Self-pay | Admitting: Licensed Clinical Social Worker

## 2015-12-29 DIAGNOSIS — F329 Major depressive disorder, single episode, unspecified: Secondary | ICD-10-CM

## 2015-12-29 DIAGNOSIS — F32A Depression, unspecified: Secondary | ICD-10-CM
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
## 2016-01-04 ENCOUNTER — Encounter: Payer: Self-pay | Admitting: Internal Medicine

## 2016-01-07 ENCOUNTER — Ambulatory Visit (INDEPENDENT_AMBULATORY_CARE_PROVIDER_SITE_OTHER): Payer: Self-pay | Admitting: Licensed Clinical Social Worker

## 2016-01-07 DIAGNOSIS — Z630 Problems in relationship with spouse or partner: Secondary | ICD-10-CM

## 2016-01-10 NOTE — Progress Notes (Signed)
   THERAPY PROGRESS NOTE  Session Time: 38mn  Participation Level: Active  Behavioral Response: Neat and Well GroomedAlertEuthymic  Type of Therapy: Couples therapy  Treatment Goals addressed: Communication: within relationship  Interventions: Supportive and Family Systems  Summary: Kathy AXLEYis a 35y.o. female who presents with a euthymic mood and appropriate affect. Mckinsey's husband of 14 years ago, [name] participated in the session as well. [name] and GCementonshared about how the met and what initially attracted them to each other. The couple shared that their dating period went incredibly fast, and that they married just three months after initially meeting. They shared that they were also brought closer by the [facility] [place], [place]." DGowrieshared that GValarie Merinowas incredibly kind and supportive after the incident. The couple both expressed that they are in counseling because they love each other deeply and want to make it work. The couple reported that they need help because GValarie Merinohas a "lying problem" and DLucettaoften feels guilty or takes on Gabriel's problems as her own. [name] expressed her pain that GValarie Merinohad recently engaged in a texting relationship with one of her co-workers. They shared that they wanted more openness and honesty in their relationship.  Suicidal/Homicidal: Nowithout intent/plan  Therapist Response: LCSW utilized supportive counseling techniques throughout the session in order to validate emotions and encourage open expression of emotion. LCSW asked DCougarand GValarie Merinoto share about the beginning of their relationship. LCSW asked them to identify why they are in counseling together. LCSW and couple processed about the problems that brought them to counseling.  Plan: Return again in 1 weeks.  Diagnosis: Axis I: See current hospital  problem list    Axis II: No diagnosis    NMetta Clines LCSW 01/10/2016

## 2016-01-14 ENCOUNTER — Ambulatory Visit (INDEPENDENT_AMBULATORY_CARE_PROVIDER_SITE_OTHER): Payer: Self-pay | Admitting: Licensed Clinical Social Worker

## 2016-01-14 DIAGNOSIS — Z630 Problems in relationship with spouse or partner: Secondary | ICD-10-CM
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
## 2016-01-25 ENCOUNTER — Other Ambulatory Visit: Payer: Self-pay | Admitting: Licensed Clinical Social Worker

## 2016-02-04 ENCOUNTER — Ambulatory Visit (INDEPENDENT_AMBULATORY_CARE_PROVIDER_SITE_OTHER): Payer: Self-pay | Admitting: Licensed Clinical Social Worker

## 2016-02-04 DIAGNOSIS — Z630 Problems in relationship with spouse or partner: Secondary | ICD-10-CM

## 2016-02-08 NOTE — Progress Notes (Signed)
   THERAPY PROGRESS NOTE  Session Time: 60min  Participation Level: Active  Behavioral Response: Neat and Well GroomedAlertEuthymic  Type of Therapy: Family Therapy  Treatment Goals addressed: Coping  Interventions: Supportive and Reframing  Summary: Kathy PacerDulce V Curry is a 35 y.o. female who presents with a euthymic mood and appropriate affect. Husband Kathy Curry presented with a euthymic mood and appropriate affect as well. The couple reported that they were doing a good job at focusing on reflective communication, as practiced in last session. Kathy Curry stated that she felt more heard and understood. Kathy Curry reported that he felt better knowing that he was able to focus more on his wife. 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 Kathy Curry and Kathy Curry discussed the division of labor within the couple and household; both acknowledged that [place]ulce is in charge of the majority of responsibilities. They processed about their strengths and how Kathy Curry might contribute more to the household.   Suicidal/Homicidal: Nowithout intent/plan  Therapist Response: LCSW utilized supportive counseling techniques throughout the session in order to validate emotions and encourage open expression of emotion. LCSW and couple processed about their current stressors as well as the progress they are making. LCSW provided affirmations to the couple for following through on more positive communication techniques.  Plan: Return again in 1 weeks.  Diagnosis: Axis I: See current hospital problem list    Axis II: No diagnosis    [name], LCSW 02/08/2016

## 2016-02-11 ENCOUNTER — Ambulatory Visit (INDEPENDENT_AMBULATORY_CARE_PROVIDER_SITE_OTHER): Payer: Self-pay | Admitting: Licensed Clinical Social Worker

## 2016-02-11 DIAGNOSIS — Z630 Problems in relationship with spouse or partner: Secondary | ICD-10-CM

## 2016-02-16 NOTE — Progress Notes (Signed)
   THERAPY PROGRESS NOTE  Session Time: 50min  Participation Level: Active  Behavioral Response: Neat and Well GroomedAlertEuthymic  Type of Therapy: Family Therapy  Treatment Goals addressed: Communication: within marriage and Coping  Interventions: Supportive  Summary: Kara PacerDulce V Membreno is a 35 y.o. female who presents with a positive mood and appropriate affect. Husband Vicente SereneGabriel also had a positive mood. Couple reported that they have been doing very well lately in engaging in more open, respectful communication. Mariamawit shared her frustration and anger due to having to see the woman her husband was texting with at various community and work functions. She reported that she tries to politely engage with her without really talking, but that afterwards her anger towards Vicente SereneGabriel has re-surfaced. She shared that she has been more sarcastic with him lately. Vicente SereneGabriel shared that he has noticed Yula's increased anger and sarcasm, and that he is trying to be patient and listen to her feelings. Terriana confirmed that it felt good to express her feelings to [place] and know that he is listening instead of getting defensive. Vicente SereneGabriel reported that he had talked openly with his children about his habit of lying and his new commitment to honesty; he expressed feeling proud of himself for this change. The couple shared that one of their children has shown a significant change in being honest instead of lying.   Suicidal/Homicidal: Nowithout intent/plan  Therapist Response: LCSW utilized supportive counseling techniques throughout the session in order to validate emotions and encourage open expression of emotion. LCSW and couple processed about their current stressors and coping skills. LCSW provided affirmations for the hard work that they are doing in changing their communication style and ability to trust.  Plan: Return again in 2 weeks.  Diagnosis: Axis I: See current hospital problem list    Axis II: No  diagnosis    Nilda Simmeratosha Corinne Goucher, LCSW 02/16/2016

## 2016-02-25 ENCOUNTER — Other Ambulatory Visit: Payer: Self-pay | Admitting: Licensed Clinical Social Worker

## 2016-04-04 HISTORY — PX: VAGINAL HYSTERECTOMY: SUR661

## 2016-04-05 ENCOUNTER — Telehealth: Payer: Self-pay | Admitting: Internal Medicine

## 2016-04-05 NOTE — Telephone Encounter (Signed)
Patient called requesting medication refill for metFORMIN (GLUCOPHAGE) 500 MG tablet  Please send to default pharmacy [facility] [address] - [place], [place] - [phone] [address]

## 2016-04-06 ENCOUNTER — Other Ambulatory Visit: Payer: Self-pay | Admitting: Internal Medicine

## 2016-04-06 DIAGNOSIS — R739 Hyperglycemia, unspecified: Secondary | ICD-10-CM

## 2016-04-06 MED ORDER — METFORMIN HCL 500 MG PO TABS: 500.0000 mg | ORAL_TABLET | Freq: Two times a day (BID) | ORAL | 11 refills | Status: DC

## 2016-04-07 NOTE — Telephone Encounter (Signed)
Called and spoke with patient 2 days ago--[facility] [address] [facility] as [facility] and closer to [place].

## 2016-04-10 ENCOUNTER — Other Ambulatory Visit: Payer: Self-pay | Admitting: Internal Medicine

## 2016-04-10 DIAGNOSIS — R300 Dysuria: Secondary | ICD-10-CM

## 2016-04-10 LAB — POCT URINALYSIS DIPSTICK
BILIRUBIN UA: NEGATIVE
Blood, UA: NEGATIVE
GLUCOSE UA: NEGATIVE
PROTEIN UA: 15
Urobilinogen, UA: NEGATIVE
pH, UA: 6

## 2016-04-10 MED ORDER — PHENAZOPYRIDINE HCL 200 MG PO TABS: ORAL_TABLET | ORAL | 0 refills | Status: DC

## 2016-04-10 MED ORDER — CIPROFLOXACIN HCL 500 MG PO TABS: 500.0000 mg | ORAL_TABLET | Freq: Two times a day (BID) | ORAL | 0 refills | Status: DC

## 2016-04-10 NOTE — Progress Notes (Signed)
Here for UA and possible urine culture as having dysuria following her hysterectomy surgery. Did have a catheter for a bit after surgery.

## 2016-04-10 NOTE — Telephone Encounter (Signed)
Patient received call from Dr. Delrae AlfredMulberry.

## 2016-04-12 LAB — URINE CULTURE

## 2016-04-21 ENCOUNTER — Ambulatory Visit (INDEPENDENT_AMBULATORY_CARE_PROVIDER_SITE_OTHER): Payer: No Typology Code available for payment source | Admitting: Internal Medicine

## 2016-04-21 VITALS — BP 112/70 | HR 78 | Ht 62.0 in | Wt 211.0 lb

## 2016-04-21 DIAGNOSIS — R7303 Prediabetes: Secondary | ICD-10-CM

## 2016-04-21 DIAGNOSIS — M1990 Unspecified osteoarthritis, unspecified site: Secondary | ICD-10-CM

## 2016-04-21 DIAGNOSIS — N921 Excessive and frequent menstruation with irregular cycle: Secondary | ICD-10-CM

## 2016-04-21 NOTE — Progress Notes (Signed)
   Subjective:    Patient ID: Kathy Curry, female    DOB: July 18, 1980, 35 y.o.   MRN: [id number]  HPI  Here with husband  1.  Status post vaginal hysterectomy for menometrorrhagia 04/04/2016, complicated by cystitis outpatient.  Took ciprofloxacin and doing much better with that. See urine labs from 10.30/2017.  2.  Prediabetes:  Weight down to 211 lbs.  Stopped the Metformin 1 week before the surgery. Just started back for 1 week.  Feels well on the med.  [facility] and [facility] to [facility].  Last A1C 5.6% 12/15/2015.   3.  Rashes and arthritis:  Rashes have resolved with start of Plaquenil.  Does still have tongue sores with citrus, but not like she had previously.  Still not clear what her disease process is exactly--Has elevated RF, but citrullinated peptide is normal Current Meds  Medication Sig  . hydroxychloroquine (PLAQUENIL) 200 MG tablet Take 200 mg by mouth 2 (two) times daily.  . metFORMIN (GLUCOPHAGE) 500 MG tablet Take 1 tablet (500 mg total) by mouth 2 (two) times daily with a meal.   No Known Allergies   Review of Systems     Objective:   Physical Exam  Moving a bit gingerly following surgery for vaginal hysterectomy Lungs:  CTA CV:  RRR without murmur or rub, radial pulses normal and equal. LE:  No edema.         Assessment & Plan:  1.  Recent Vaginal Hysterectomy for menometrorrhagia with complication of cystitis.  Doing well  2.  Prediabetes:  Returning to Metformin and getting back to physical activity and eating in a healthy way with family.  3.  Rashes and polyarthritis:  Uncertain diagnosis for autoimmune or CTD.  Doing well since start of Plaquenil.  [facility]  Follow up in 3 months for CPE.

## 2016-06-17 ENCOUNTER — Encounter: Payer: Self-pay | Admitting: Internal Medicine

## 2016-07-08 ENCOUNTER — Encounter: Payer: Self-pay | Admitting: Internal Medicine

## 2016-08-17 ENCOUNTER — Other Ambulatory Visit: Payer: Self-pay

## 2016-08-17 ENCOUNTER — Encounter: Payer: Self-pay | Admitting: Pediatric Intensive Care

## 2016-08-25 ENCOUNTER — Encounter: Payer: Self-pay | Admitting: Internal Medicine

## 2016-09-12 ENCOUNTER — Other Ambulatory Visit (INDEPENDENT_AMBULATORY_CARE_PROVIDER_SITE_OTHER): Payer: Self-pay

## 2016-09-12 DIAGNOSIS — Z79899 Other long term (current) drug therapy: Secondary | ICD-10-CM

## 2016-09-12 DIAGNOSIS — R7303 Prediabetes: Secondary | ICD-10-CM

## 2016-09-12 DIAGNOSIS — Z13220 Encounter for screening for lipoid disorders: Secondary | ICD-10-CM

## 2016-09-13 ENCOUNTER — Encounter: Payer: Self-pay | Admitting: Internal Medicine

## 2016-09-13 LAB — CBC WITH DIFFERENTIAL/PLATELET
BASOS ABS: 0 10*3/uL (ref 0.0–0.2)
Basos: 0 %
EOS (ABSOLUTE): 0.1 10*3/uL (ref 0.0–0.4)
Eos: 1 %
HEMOGLOBIN: 10.1 g/dL — AB (ref 11.1–15.9)
Hematocrit: 32.4 % — ABNORMAL LOW (ref 34.0–46.6)
IMMATURE GRANS (ABS): 0 10*3/uL (ref 0.0–0.1)
Immature Granulocytes: 0 %
LYMPHS: 34 %
Lymphocytes Absolute: 1.9 10*3/uL (ref 0.7–3.1)
MCH: 21.2 pg — ABNORMAL LOW (ref 26.6–33.0)
MCHC: 31.2 g/dL — AB (ref 31.5–35.7)
MCV: 68 fL — ABNORMAL LOW (ref 79–97)
MONOCYTES: 6 %
Monocytes Absolute: 0.3 10*3/uL (ref 0.1–0.9)
Neutrophils Absolute: 3.2 10*3/uL (ref 1.4–7.0)
Neutrophils: 59 %
PLATELETS: 297 10*3/uL (ref 150–379)
RBC: 4.76 x10E6/uL (ref 3.77–5.28)
RDW: 18.9 % — ABNORMAL HIGH (ref 12.3–15.4)
WBC: 5.6 10*3/uL (ref 3.4–10.8)

## 2016-09-13 LAB — LIPID PANEL W/O CHOL/HDL RATIO
CHOLESTEROL TOTAL: 197 mg/dL (ref 100–199)
HDL: 43 mg/dL (ref >39)
LDL CALC: 124 mg/dL — AB (ref 0–99)
TRIGLYCERIDES: 148 mg/dL (ref 0–149)
VLDL CHOLESTEROL CAL: 30 mg/dL (ref 5–40)

## 2016-09-13 LAB — HGB A1C W/O EAG: Hgb A1c MFr Bld: 5.6 % (ref 4.8–5.6)

## 2016-09-19 NOTE — Congregational Nurse Program (Signed)
Congregational Nurse Program Note  Date of Encounter: 08/17/2016  Past Medical History: Past Medical History:  Diagnosis Date  . Prediabetes 11/09/2015  . Rheumatoid arthritis (HCC) 11/05/2012  . Sicca syndrome (HCC) 09/16/2015    Encounter Details: Client reports several day history of malaise, fever and chills along with cough. BBS clear but with non-productive hoarse cough. Client was seen by PCP and has prescriptions. CN will obtain prescriptions via [facility].

## 2016-09-22 ENCOUNTER — Encounter: Payer: Self-pay | Admitting: Internal Medicine

## 2016-09-22 ENCOUNTER — Ambulatory Visit (INDEPENDENT_AMBULATORY_CARE_PROVIDER_SITE_OTHER): Payer: Self-pay | Admitting: Internal Medicine

## 2016-09-22 VITALS — BP 122/82 | HR 74 | Resp 12 | Ht 62.5 in | Wt 222.0 lb

## 2016-09-22 DIAGNOSIS — D500 Iron deficiency anemia secondary to blood loss (chronic): Secondary | ICD-10-CM

## 2016-09-22 DIAGNOSIS — Z0000 Encounter for general adult medical examination without abnormal findings: Secondary | ICD-10-CM

## 2016-09-22 DIAGNOSIS — T7840XA Allergy, unspecified, initial encounter: Secondary | ICD-10-CM | POA: Insufficient documentation

## 2016-09-22 DIAGNOSIS — R7303 Prediabetes: Secondary | ICD-10-CM

## 2016-09-22 DIAGNOSIS — T7840XD Allergy, unspecified, subsequent encounter: Secondary | ICD-10-CM

## 2016-09-22 LAB — GLUCOSE, POCT (MANUAL RESULT ENTRY): POC Glucose: 114 mg/dl — AB (ref 70–99)

## 2016-09-22 MED ORDER — FERROUS GLUCONATE 324 (38 FE) MG PO TABS: ORAL_TABLET | ORAL | Status: DC

## 2016-09-22 NOTE — Progress Notes (Signed)
Subjective:    Patient ID: Kathy Curry, female    DOB: 1980-10-21, 36 y.o.   MRN: [id number]  HPI   CPE without pap  1.  Pap:  Status post vaginal hysterectomy for menorrhagia with benign findings on path/Adenomyosis.  Last pap 10/11/2015 normal.  All normal in past.  Sister with history of cervical cancer.  2.  Mammogram:  Never.  No family history.  3.  Osteoprevention:  Eats cheese daily, one serving.  Has allergy issues with dairy.  Does not get outside much as gets rashes with sun exposure.  Not taking supplements now.   Is drinking 2 servings of almond milk daily. [facility]  Biking and treadmill. 3-4 times weekly.    4.  Guaiac Cards:  .  Never.  No family history  5.  Colonoscopy:  Never.  No family history  6.  Immunizations:  Cannot recall last tetanus--more than 10 years ago.  Immunization History  Administered Date(s) Administered  . Influenza-Unspecified 03/27/2016   7.  Glucose/cholesterol:  A1C 09/12/2016 5.6% with history of prediabetes.   Lipid Panel     Component Value Date/Time   CHOL 197 09/12/2016 0849   TRIG 148 09/12/2016 0849   HDL 43 09/12/2016 0849   CHOLHDL 5.2 (H) 03/26/2015 0854   LDLCALC 124 (H) 09/12/2016 0849   Really good diet and physical activity  Current Meds  Medication Sig  . hydroxychloroquine (PLAQUENIL) 200 MG tablet Take 200 mg by mouth 2 (two) times daily.  . metFORMIN (GLUCOPHAGE) 500 MG tablet Take 1 tablet (500 mg total) by mouth 2 (two) times daily with a meal.    Allergies  Allergen Reactions  . Meat Extract Rash    Also, mouth ulcers.  Has tested negative with allergist/rheumatologist, however.  Generally eats vegetarian diet due to this.    Past Medical History:  Diagnosis Date  . Allergy    tested negative save for dust mites, but has rashes possibly to foods.  . Anemia 2016   iron deficiency  . Prediabetes 11/09/2015  . Rheumatoid arthritis (HCC) 11/05/2012  . 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      Past Surgical History:  Procedure Laterality Date  . 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 VAGINAL HYSTERECTOMY  04/04/2016   [facility] for benign adenomyosis and menorrhagia.         Review of Systems  Constitutional: Negative for appetite change, fatigue, fever and unexpected weight change.  HENT: Positive for mouth sores (with certain foods). Negative for dental problem, hearing loss, rhinorrhea, sinus pain and sore throat.   Eyes:       Eye dryness with SICCA syndrome  Respiratory: Negative for cough and shortness of breath.   Cardiovascular: Positive for leg swelling (after sitting for prolonged period). Negative for chest pain and palpitations.  Gastrointestinal: Positive for abdominal pain (Right lower quadrant.  Tearing sensation inside--generally associated with physical activity.   Not worsening with time.  Started 2 weeks after hysterectomy.  states one fallopian tube could not be completely removed and had to work to get what they could ). Negative for blood in stool, constipation, diarrhea and nausea.  Genitourinary: Negative for dysuria, urgency and vaginal discharge.  Musculoskeletal: Positive for arthralgias (Still with some joint issues of hands--followed by Rheum at  [facility]).  Skin: Positive for rash (tiny pustular type lesions on chest, upper arms and thighs).  Neurological: Positive for numbness (hands). Negative for weakness.  Hematological: Bruises/bleeds easily.  Psychiatric/Behavioral: Negative for dysphoric mood. The patient is not nervous/anxious.        Objective:   Physical Exam  Constitutional: She is oriented to person, place, and time. She appears well-developed and well-nourished.  HENT:  Head: Normocephalic and atraumatic.  Right Ear: Hearing, tympanic membrane, external ear and ear canal normal.  Left  Ear: Hearing, tympanic membrane, external ear and ear canal normal.  Nose: Nose normal.  Mouth/Throat: Uvula is midline, oropharynx is clear and moist and mucous membranes are normal.  Eyes: Conjunctivae and EOM are normal. Pupils are equal, round, and reactive to light.  Discs sharp bilaterally  Neck: Normal range of motion. Neck supple. No thyroid mass and no thyromegaly present.  Cardiovascular: Normal rate and regular rhythm.  Exam reveals no S3, no S4 and no friction rub.   No murmur heard. No carotid bruits.  Carotid, radial, femoral, DP and PT pulses normal and equal.   Pulmonary/Chest: Effort normal and breath sounds normal. Right breast exhibits no inverted nipple, no mass, no nipple discharge, no skin change and no tenderness. Left breast exhibits no inverted nipple, no mass, no nipple discharge, no skin change and no tenderness.  Significant axillary breast tissue without overlying nipple.  No focal mass, skin dimpling. Mild tenderness in areas of fold.  Abdominal: Soft. Bowel sounds are normal. She exhibits no mass. There is no hepatosplenomegaly. There is no tenderness. No hernia. Hernia confirmed negative in the right inguinal area and confirmed negative in the left inguinal area.  Genitourinary: Vagina normal. Right adnexum displays tenderness. Right adnexum displays no mass and no fullness. Left adnexum displays no mass, no tenderness and no fullness. No vaginal discharge found.  Genitourinary Comments: No palpable uterus or cervix.  Musculoskeletal: Normal range of motion.  Lymphadenopathy:       Head (right side): No submental and no submandibular adenopathy present.       Head (left side): No submental and no submandibular adenopathy present.    She has no cervical adenopathy.    She has no axillary adenopathy.       Right: No inguinal and no supraclavicular adenopathy present.       Left: No inguinal and no supraclavicular adenopathy present.  Neurological: She is alert and  oriented to person, place, and time. She has normal strength and normal reflexes. No cranial nerve deficit or sensory deficit. Coordination and gait normal.  Skin: Skin is warm and dry. No lesion noted.  1 small pink lesion left chest with scratched 2 mm center.   Appeared from a distance to be a small pustule in center before patient scratched.   2 small 0.5 cm pink raised lesions, one on either inner thigh--very faint.  Psychiatric: She has a normal mood and affect. Her speech is normal and behavior is normal. Judgment and thought content normal. Cognition and memory are normal.          Assessment & Plan:  1.  CPE Discussed labs today. No need for pap. Encouraged 3-4 servings of cheese or almond milk daily.  2.  Prediabetes:  Has this well controlled with low dose Metformin and lifestyle changes.  3.  Dyslipidemia:  Working on this with lifestyle changes.  4.  Anemia:  Ferrous gluconate 324 mg twice daily with half and orange.  Repeat CBC in  6 weeks.  5.  Skin lesion:  Few and very faint.  To call if worsens to get a better idea.  Otherwise, will see back in 6 months following A1C.

## 2016-09-22 NOTE — Patient Instructions (Addendum)
Can google "advance directives, [place]"  And bring up form from Secretary of [place]. Print and fill out Or can go to "5 wishes"  Which is also in Spanish and fill out--this costs $5--perhaps easier to use. Designate a Medical Power of Attorney to speak for you if you are unable to speak for yourself when ill or injured   Call if pain in right lower abdomen worsens

## 2017-01-17 ENCOUNTER — Ambulatory Visit (INDEPENDENT_AMBULATORY_CARE_PROVIDER_SITE_OTHER): Payer: Self-pay | Admitting: Internal Medicine

## 2017-01-17 VITALS — BP 130/82 | HR 70 | Resp 12 | Ht 62.5 in | Wt 221.0 lb

## 2017-01-17 DIAGNOSIS — R109 Unspecified abdominal pain: Secondary | ICD-10-CM

## 2017-01-17 DIAGNOSIS — F439 Reaction to severe stress, unspecified: Secondary | ICD-10-CM

## 2017-01-17 DIAGNOSIS — R7303 Prediabetes: Secondary | ICD-10-CM

## 2017-01-17 DIAGNOSIS — G4700 Insomnia, unspecified: Secondary | ICD-10-CM

## 2017-01-17 LAB — POCT URINALYSIS DIPSTICK
BILIRUBIN UA: 1
Blood, UA: NEGATIVE
GLUCOSE UA: NEGATIVE
Ketones, UA: NEGATIVE
LEUKOCYTES UA: NEGATIVE
NITRITE UA: NEGATIVE
Protein, UA: 15
Spec Grav, UA: 1.01 (ref 1.010–1.025)
Urobilinogen, UA: NEGATIVE E.U./dL — AB
pH, UA: 8 (ref 5.0–8.0)

## 2017-01-17 LAB — GLUCOSE, POCT (MANUAL RESULT ENTRY): POC Glucose: 119 mg/dl — AB (ref 70–99)

## 2017-03-14 ENCOUNTER — Telehealth: Payer: Self-pay | Admitting: Internal Medicine

## 2017-03-14 ENCOUNTER — Other Ambulatory Visit (INDEPENDENT_AMBULATORY_CARE_PROVIDER_SITE_OTHER): Payer: Self-pay

## 2017-03-14 DIAGNOSIS — R319 Hematuria, unspecified: Secondary | ICD-10-CM

## 2017-03-14 LAB — POCT URINALYSIS DIPSTICK
Bilirubin, UA: NEGATIVE
Blood, UA: NEGATIVE
GLUCOSE UA: NEGATIVE
KETONES UA: NEGATIVE
Leukocytes, UA: NEGATIVE
Nitrite, UA: NEGATIVE
Protein, UA: NEGATIVE
SPEC GRAV UA: 1.01 (ref 1.010–1.025)
Urobilinogen, UA: 0.2 E.U./dL
pH, UA: 7.5 (ref 5.0–8.0)

## 2017-03-14 NOTE — Progress Notes (Signed)
Patient came into office stating she thinks she is in the beginning stages of getting a UTI. Patient states her only symptoms are a odor with her urine and lower back pain x 2 days. Patient states this how she usually starts with a UTI. UA performed and was normal. Per Dr. Delrae Alfred send urine out for culture.

## 2017-03-16 LAB — URINE CULTURE

## 2017-03-16 MED ORDER — PHENAZOPYRIDINE HCL 200 MG PO TABS: 200.0000 mg | ORAL_TABLET | Freq: Three times a day (TID) | ORAL | 0 refills | Status: DC | PRN

## 2017-03-16 MED ORDER — CIPROFLOXACIN HCL 500 MG PO TABS: 500.0000 mg | ORAL_TABLET | Freq: Two times a day (BID) | ORAL | 0 refills | Status: DC

## 2017-03-16 NOTE — Telephone Encounter (Signed)
Patient came in for office lab.

## 2017-03-16 NOTE — Addendum Note (Signed)
Addended by: Marcene Duos on: 03/16/2017 04:22 PM   Modules accepted: Orders

## 2017-03-19 ENCOUNTER — Encounter: Payer: Self-pay | Admitting: Internal Medicine

## 2017-03-19 NOTE — Patient Instructions (Signed)
For Sleep:  Read with children--Spanish books Shoer Bed at 10 p.m If not asleep in 20 minutes or cannot turn off thoughts, read until sleepy If not asleep in 20 min., repeat Up out of bed 6 a.m. And use treadmill No TV/exercise/video/computer/cleaningor other busy work in the evening when cannot sleep or just before bed.

## 2017-03-19 NOTE — Progress Notes (Signed)
   Subjective:    Patient ID: Kathy Curry, female    DOB: 12-19-1980, 36 y.o.   MRN: [id number]  HPI 1.  Prediabetes:  Weight essentially unchanged.  210-220s.  Staying physically active with treadmill daily 30-40 ".  Not as much cycling.   Last A1C May in high 5's.  Feels like she is doing all she can do with diet.  2.  Stress/insomnia:  Not sleeping due to anxiety and stress after speaking with attorney (regarding [place] in [place]   3-4 months poor sleep reliving trauma.  Goes on social media or books in bed when cannot sleep or lies in bed watching clock or gets up and cries.   Treadmill in the evening with bedtime at 12 to 1 a.m.  Up at 7 a.m.  3.  Last week--sharp ain in right low back.  No dysuria or urinary frequency over baseline.  Urine is darker.   She was seen by Urologist and underwent cystoscopy in past year at [facility] was told has "dots' inside bladder.  To notify Urologist when has symptoms.    4.  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  Meds: Metformin 500 mg twice daily Plaquenil 200 mg twice daily.  Allergies  Allergen Reactions  . Meat Extract Rash    Also, mouth ulcers.  Has tested negative with allergist/rheumatologist, however.  Generally eats vegetarian diet due to this.     Review of Systems     Objective:   Physical Exam  NAD HEENT:  PERRL, EOMI Neck:  Supple, No adenopathy Chest:  CTA CV:  RRR without murmur or rub.  Radial pulses normal and equal Axilla:  Erythematous wet skin folds of axilla Abd:  S, NT, No HSM or mass, + BS, mild right flank tenderness.   UA:  Trace protein and 1+ bili      Assessment & Plan:  1.  Prediabetes:  To continue working on physical activity and diet. Stressors and history of trauma certainly increase her risk for  DM.   2.  Stress/PTSD/Insomnia:  Encouraged returning to work with Samul Dada, LCSW when returns from parental leave for EMDR. See instructions for  sleep  3.  Intertriginous infection of axilla:  Topical Terbinafine for 14 days, twice daily.  4.  Right Flank Pain:  UA is not supportive of UTI.   STart Cipro 500 mg bid for 5 days for now.

## 2017-03-23 ENCOUNTER — Other Ambulatory Visit: Payer: Self-pay

## 2017-03-27 ENCOUNTER — Other Ambulatory Visit (INDEPENDENT_AMBULATORY_CARE_PROVIDER_SITE_OTHER): Payer: Self-pay

## 2017-03-27 DIAGNOSIS — Z79899 Other long term (current) drug therapy: Secondary | ICD-10-CM

## 2017-03-27 DIAGNOSIS — R7303 Prediabetes: Secondary | ICD-10-CM

## 2017-03-28 LAB — CBC WITH DIFFERENTIAL/PLATELET
BASOS: 1 %
Basophils Absolute: 0 10*3/uL (ref 0.0–0.2)
EOS (ABSOLUTE): 0.2 10*3/uL (ref 0.0–0.4)
EOS: 3 %
HEMATOCRIT: 35.4 % (ref 34.0–46.6)
Hemoglobin: 11.4 g/dL (ref 11.1–15.9)
IMMATURE GRANULOCYTES: 0 %
Immature Grans (Abs): 0 10*3/uL (ref 0.0–0.1)
LYMPHS: 34 %
Lymphocytes Absolute: 2.3 10*3/uL (ref 0.7–3.1)
MCH: 23.9 pg — ABNORMAL LOW (ref 26.6–33.0)
MCHC: 32.2 g/dL (ref 31.5–35.7)
MCV: 74 fL — AB (ref 79–97)
MONOCYTES: 5 %
Monocytes Absolute: 0.3 10*3/uL (ref 0.1–0.9)
NEUTROS PCT: 57 %
Neutrophils Absolute: 3.8 10*3/uL (ref 1.4–7.0)
Platelets: 294 10*3/uL (ref 150–379)
RBC: 4.76 x10E6/uL (ref 3.77–5.28)
RDW: 17.2 % — ABNORMAL HIGH (ref 12.3–15.4)
WBC: 6.6 10*3/uL (ref 3.4–10.8)

## 2017-03-28 LAB — HGB A1C W/O EAG: Hgb A1c MFr Bld: 5.7 % — ABNORMAL HIGH (ref 4.8–5.6)

## 2017-04-18 ENCOUNTER — Ambulatory Visit: Payer: Self-pay | Admitting: Internal Medicine

## 2017-05-02 ENCOUNTER — Ambulatory Visit (INDEPENDENT_AMBULATORY_CARE_PROVIDER_SITE_OTHER): Payer: Self-pay | Admitting: Licensed Clinical Social Worker

## 2017-05-02 DIAGNOSIS — F32A Depression, unspecified: Secondary | ICD-10-CM

## 2017-05-02 DIAGNOSIS — F329 Major depressive disorder, single episode, unspecified: Secondary | ICD-10-CM
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
## 2017-05-10 ENCOUNTER — Ambulatory Visit: Payer: Self-pay | Admitting: Licensed Clinical Social Worker

## 2017-05-10 DIAGNOSIS — F329 Major depressive disorder, single episode, unspecified: Secondary | ICD-10-CM

## 2017-05-10 DIAGNOSIS — F32A Depression, unspecified: Secondary | ICD-10-CM
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
## 2017-05-16 ENCOUNTER — Ambulatory Visit (INDEPENDENT_AMBULATORY_CARE_PROVIDER_SITE_OTHER): Payer: Self-pay | Admitting: Licensed Clinical Social Worker

## 2017-05-16 DIAGNOSIS — F4310 Post-traumatic stress disorder, unspecified: Secondary | ICD-10-CM
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
## 2017-05-23 ENCOUNTER — Other Ambulatory Visit: Payer: Self-pay | Admitting: Licensed Clinical Social Worker

## 2017-05-23 ENCOUNTER — Ambulatory Visit: Payer: Self-pay | Admitting: Internal Medicine

## 2017-05-23 ENCOUNTER — Encounter: Payer: Self-pay | Admitting: Internal Medicine

## 2017-05-23 VITALS — BP 130/78 | HR 80 | Resp 12 | Ht 62.5 in | Wt 226.0 lb

## 2017-05-23 DIAGNOSIS — R7303 Prediabetes: Secondary | ICD-10-CM

## 2017-05-23 DIAGNOSIS — F4310 Post-traumatic stress disorder, unspecified: Secondary | ICD-10-CM

## 2017-05-23 DIAGNOSIS — D500 Iron deficiency anemia secondary to blood loss (chronic): Secondary | ICD-10-CM

## 2017-05-23 DIAGNOSIS — G479 Sleep disorder, unspecified: Secondary | ICD-10-CM

## 2017-05-23 MED ORDER — CLONAZEPAM 1 MG PO TABS: ORAL_TABLET | ORAL | 0 refills | Status: DC

## 2017-05-23 MED ORDER — METFORMIN HCL 500 MG PO TABS: 500.0000 mg | ORAL_TABLET | Freq: Two times a day (BID) | ORAL | 11 refills | Status: DC

## 2017-05-23 MED ORDER — MELATONIN 1 MG PO TABS: ORAL_TABLET | ORAL | Status: DC
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
## 2017-05-23 NOTE — Patient Instructions (Addendum)
[facility] [address]  [place], [place] [id number] [phone] 40% discount for [employer] patient  Clonzepam can be filled every 3 months

## 2017-05-30 ENCOUNTER — Ambulatory Visit (INDEPENDENT_AMBULATORY_CARE_PROVIDER_SITE_OTHER): Payer: Self-pay | Admitting: Licensed Clinical Social Worker

## 2017-05-30 DIAGNOSIS — F4310 Post-traumatic stress disorder, unspecified: Secondary | ICD-10-CM
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
## 2017-06-06 ENCOUNTER — Other Ambulatory Visit: Payer: Self-pay | Admitting: Licensed Clinical Social Worker

## 2017-06-08 ENCOUNTER — Encounter: Payer: Self-pay | Admitting: Internal Medicine

## 2017-06-08 ENCOUNTER — Ambulatory Visit: Payer: Self-pay | Admitting: Internal Medicine

## 2017-06-08 VITALS — BP 118/80 | HR 82 | Temp 97.9°F | Resp 14 | Ht 62.5 in | Wt 225.0 lb

## 2017-06-08 DIAGNOSIS — J209 Acute bronchitis, unspecified: Secondary | ICD-10-CM

## 2017-06-08 MED ORDER — AZITHROMYCIN 250 MG PO TABS: ORAL_TABLET | ORAL | 0 refills | Status: DC

## 2017-06-08 NOTE — Progress Notes (Signed)
   Subjective:    Patient ID: Kathy Curry, female    DOB: 08-30-1980, 36 y.o.   MRN: [id number]  HPI   Has had cough for about 1 week.  Has worsened in last 3 days.  Lost her voice at one point, but better today.   Feels a bit of nasal and sinus congestion, but yesterday, feels rattle in her mid anterior chest and felt she should be seen.   When she coughs, she feels she is not really bringing up sputum--just saliva.  Clear. Clear nasal drainage. Felt feverish a couple of days ago.   No dyspnea.   Some sleep with cough--is disruptive.   Having right ear pain today only.  No one ill at home.  She is not aware of anyone being ill at work last week. Not taking anything over the counter currently, though was taking unknown "[place]"  OTC for cough last week.  Did not really help.    Current Meds  Medication Sig  . clonazePAM (KLONOPIN) 1 MG tablet 1/2 to 1 tab by mouth at bedtime as needed.  . ferrous gluconate (FERGON) 324 MG tablet 1 tab by mouth with half and orange twice daily  . hydroxychloroquine (PLAQUENIL) 200 MG tablet Take 200 mg by mouth 2 (two) times daily.  . metFORMIN (GLUCOPHAGE) 500 MG tablet Take 1 tablet (500 mg total) by mouth 2 (two) times daily with a meal.    Allergies  Allergen Reactions  . Meat Extract Rash    Also, mouth ulcers.  Has tested negative with allergist/rheumatologist, however.  Generally eats vegetarian diet due to this.      Review of Systems     Objective:   Physical Exam Coughing throughout exam--dry sounding for most part HEENT:  PERRL, EOMI, TMs pearly gray, throat without injection.  Nasal mucosa red and swollen with clear discharge.  NT over maxillary and frontal sinuses. Neck:  Supple No adenopathy Chest:  CTA CV:  RRR without murmur or rub.  Radial pulses normal and equal       Assessment & Plan:  Acute bronchitis vs. Sinusitis  Z pack as seems to be worsening.  Dayquil/Nyquil combination. Drink water regularly

## 2017-06-08 NOTE — Patient Instructions (Signed)
Recommend Dayquil/Nyquil (may want to cut back on Nyquil dosing as can get a hangover Drink lots of water to thin mucous

## 2017-06-15 ENCOUNTER — Ambulatory Visit: Payer: Self-pay | Admitting: Internal Medicine

## 2017-06-15 ENCOUNTER — Telehealth: Payer: Self-pay | Admitting: Internal Medicine

## 2017-06-15 ENCOUNTER — Encounter: Payer: Self-pay | Admitting: Internal Medicine

## 2017-06-15 VITALS — BP 112/78 | HR 86 | Temp 97.8°F | Resp 18 | Ht 62.5 in | Wt 225.0 lb

## 2017-06-15 DIAGNOSIS — R059 Cough, unspecified: Secondary | ICD-10-CM

## 2017-06-15 DIAGNOSIS — R7303 Prediabetes: Secondary | ICD-10-CM

## 2017-06-15 DIAGNOSIS — R05 Cough: Secondary | ICD-10-CM

## 2017-06-15 MED ORDER — PREDNISONE 20 MG PO TABS
ORAL_TABLET | ORAL | 0 refills | Status: DC
Start: 2017-06-15 — End: 2017-08-22

## 2017-06-15 MED ORDER — BENZONATATE 100 MG PO CAPS: ORAL_CAPSULE | ORAL | 0 refills | Status: DC

## 2017-06-15 NOTE — Telephone Encounter (Signed)
Per Dr. Delrae AlfredMulberry have patient come in for quick recheck.. Patient coming into office today

## 2017-06-15 NOTE — Patient Instructions (Signed)
Call report on Monday or over weekend if shortness of breath or increasing symptoms

## 2017-06-15 NOTE — Telephone Encounter (Signed)
Spoke with patient states cough is not any better and she feels horrible. No body aches just headache and cough and slight fever.

## 2017-06-15 NOTE — Telephone Encounter (Signed)
Patient called stating after last OV on 06/08/17 was having some improvement until yesterday when started to have headache, sore throat and a little bit of fever and stated today is feeling worse. Please advise.

## 2017-06-15 NOTE — Progress Notes (Signed)
   Subjective:    Patient ID: Kathy Curry, female    DOB: 1981/04/13, 37 y.o.   MRN: [id number]  HPI   1. Cough:  Was doing much better after Zpack given on 06/08/2017 and went back to work yesterday.  Minimal residual cough in the morning, though has continued to use Dayquil and Nyquil regularly. 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  She was not in the office when the painting was done and her cough started a couple of days when she was off over the holidays.  Office does smell of paint. Noted she was coughing a lot more by lunchtime yesterday.  Unable to go to work this morning due to cough. No definite fever, though felt cold and shivering last night. Her left throat hurts today.  Anterior chest feels a bit tight.   Cough is productive of yellowish mucous. No eye symptoms. No significant sneezing.  Current Meds  Medication Sig  . clonazePAM (KLONOPIN) 1 MG tablet 1/2 to 1 tab by mouth at bedtime as needed.  . ferrous gluconate (FERGON) 324 MG tablet 1 tab by mouth with half and orange twice daily  . hydroxychloroquine (PLAQUENIL) 200 MG tablet Take 200 mg by mouth 2 (two) times daily.  . metFORMIN (GLUCOPHAGE) 500 MG tablet Take 1 tablet (500 mg total) by mouth 2 (two) times daily with a meal.    Allergies  Allergen Reactions  . Meat Extract Rash    Also, mouth ulcers.  Has tested negative with allergist/rheumatologist, however.  Generally eats vegetarian diet due to this.      Review of Systems     Objective:   Physical Exam Hard coughing throughout much of visit. HEENT:  PERRL, EOMI, TMs pearly gray.  Nasal mucosa somewhat swollen with clear discharge,  Left tonsil mildly inflamed without exudate.  MMM Neck:  Supple, no adenopathy Chest:  CTA with good ari movement. CV:  RRR without murmur or rub, radial pulses normal and equal. Skin:  No rash      Assessment & Plan:  1.  Cough:  Significant improvement prior to returning to work and likely with  exposure to residual paint fumes, cough recurred significantly with irritation to upper areas.  No wheezing No significant findings on exam.  Throat likely worse due to coughing. Tessalon perles along with Prednisone to calm airway irritation:  40 mg daily for 5 days Will have 2 days away from office as well. To call if any new or worsening symptoms over weekend and to call progress report on Monday.  2.  Prediabetes/obesity:  Rx for stationary bike to utilize during inclement weather to improve these issues.

## 2017-06-20 ENCOUNTER — Ambulatory Visit (INDEPENDENT_AMBULATORY_CARE_PROVIDER_SITE_OTHER): Payer: Self-pay | Admitting: Licensed Clinical Social Worker

## 2017-06-20 DIAGNOSIS — F4310 Post-traumatic stress disorder, unspecified: Secondary | ICD-10-CM
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
## 2017-06-27 ENCOUNTER — Ambulatory Visit: Payer: Self-pay | Admitting: Licensed Clinical Social Worker

## 2017-06-27 DIAGNOSIS — F4310 Post-traumatic stress disorder, unspecified: Secondary | ICD-10-CM
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
## 2017-07-11 ENCOUNTER — Ambulatory Visit: Payer: Self-pay | Admitting: Licensed Clinical Social Worker

## 2017-07-11 DIAGNOSIS — F4310 Post-traumatic stress disorder, unspecified: Secondary | ICD-10-CM
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
## 2017-07-25 ENCOUNTER — Ambulatory Visit: Payer: Self-pay | Admitting: Licensed Clinical Social Worker

## 2017-07-25 DIAGNOSIS — F4310 Post-traumatic stress disorder, unspecified: Secondary | ICD-10-CM
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
## 2017-08-01 ENCOUNTER — Ambulatory Visit: Payer: Self-pay | Admitting: Licensed Clinical Social Worker

## 2017-08-01 DIAGNOSIS — F4310 Post-traumatic stress disorder, unspecified: Secondary | ICD-10-CM
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
## 2017-08-22 ENCOUNTER — Other Ambulatory Visit: Payer: Self-pay | Admitting: Licensed Clinical Social Worker

## 2017-08-22 ENCOUNTER — Encounter: Payer: Self-pay | Admitting: Internal Medicine

## 2017-08-22 ENCOUNTER — Ambulatory Visit: Payer: Self-pay | Admitting: Internal Medicine

## 2017-08-22 VITALS — BP 122/82 | HR 76 | Resp 12 | Ht 62.5 in | Wt 227.0 lb

## 2017-08-22 DIAGNOSIS — L739 Follicular disorder, unspecified: Secondary | ICD-10-CM

## 2017-08-22 DIAGNOSIS — R7303 Prediabetes: Secondary | ICD-10-CM

## 2017-08-22 DIAGNOSIS — J302 Other seasonal allergic rhinitis: Secondary | ICD-10-CM

## 2017-08-22 MED ORDER — FEXOFENADINE HCL 180 MG PO TABS
180.0000 mg | ORAL_TABLET | Freq: Every day | ORAL | 11 refills | Status: DC
Start: 2017-08-22 — End: 2020-03-19

## 2017-08-22 NOTE — Progress Notes (Signed)
   Subjective:    Patient ID: Kathy Curry, female    DOB: August 15, 1980, 37 y.o.   MRN: [id number]  HPI     1.  Prediabetes/Obesity:  Lost her letter to get the stationary bike.  Needs another.  She is walking.  Weight is up 2 lbs  2.  "pimples"  On left breast most often, maybe once or twice a month.  Not having currently.  Shows me photos of small pustules on her left breast.  They resolve after draining a bit of white pus.   [facility]   She is to go only if she has findings of the pustules or her hives.    3.  Seasonal allergies:  Nasal congestion, more so in the morning.  Mild cough.  Has not tried anything.   Allegra works for her.  Current Meds  Medication Sig  . clonazePAM (KLONOPIN) 1 MG tablet 1/2 to 1 tab by mouth at bedtime as needed.  . ferrous gluconate (FERGON) 324 MG tablet 1 tab by mouth with half and orange twice daily  . hydroxychloroquine (PLAQUENIL) 200 MG tablet Take 200 mg by mouth 2 (two) times daily.  . metFORMIN (GLUCOPHAGE) 500 MG tablet Take 1 tablet (500 mg total) by mouth 2 (two) times daily with a meal.    Allergies  Allergen Reactions  . Meat Extract Rash    Also, mouth ulcers.  Has tested negative with allergist/rheumatologist, however.  Generally eats vegetarian diet due to this.    Review of Systems     Objective:   Physical Exam HEENT:  PERRL, EOMI, conjunctivae without injection.  Nasal mucosa boggy with clear discharge. Mild cobbling of posterior pharynx. Neck:  Supple, No adenopathy Chest:  CTA CV: RRR without murmur or rub, radial and DP pulses normal and equal. Abd:  S, NT, No HSM, + BS       Assessment & Plan:  1.  Prediabetes and obesity:  To get set up with stationary bike and get more active.  Describes a healthy diet.    2.  Allergies:  Fexofenadine 180 mg daily.  Keep windows closed.  Shoes off at front door.  3.  Describes recurrent folliculitis:  Discussed possibly treating with intranasal  mupirocin.  No lesions today, however. Discussed local treatment with warm compresses.

## 2017-08-27 ENCOUNTER — Other Ambulatory Visit: Payer: Self-pay | Admitting: Licensed Clinical Social Worker

## 2017-09-12 ENCOUNTER — Ambulatory Visit: Payer: Self-pay | Admitting: Licensed Clinical Social Worker

## 2017-09-12 DIAGNOSIS — F4310 Post-traumatic stress disorder, unspecified: Secondary | ICD-10-CM
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
## 2017-09-26 ENCOUNTER — Ambulatory Visit: Payer: Self-pay | Admitting: Licensed Clinical Social Worker

## 2017-09-26 DIAGNOSIS — F4310 Post-traumatic stress disorder, unspecified: Secondary | ICD-10-CM
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
## 2017-10-10 ENCOUNTER — Other Ambulatory Visit: Payer: Self-pay | Admitting: Licensed Clinical Social Worker

## 2017-10-24 ENCOUNTER — Ambulatory Visit: Payer: Self-pay | Admitting: Licensed Clinical Social Worker

## 2017-10-24 DIAGNOSIS — F4310 Post-traumatic stress disorder, unspecified: Secondary | ICD-10-CM

## 2017-10-25 NOTE — Progress Notes (Signed)
   THERAPY PROGRESS NOTE  Session Time:  Participation Level: Active  Behavioral Response: Neat and Well GroomedAlertEuthymic  Type of Therapy: Individual Therapy  Treatment Goals addressed: Coping  Interventions: Strength-based and Supportive  Summary: Kathy Curry is a 37 y.o. female who presents with a euthymic mood and appropriate affect. She reported that she was frustrated because she seemed to have taken a step back in her progress lately, as she again was having frequent crying spells and increased irritability. [name] stated that she just keeps wondering "what I'm doing wrong." [facility] and LCSW explored possibly triggers for her tearfulness and irritability, including residual feelings from trauma work, changes in hormones, or stress. She reported that she is continuing to be active at home, typically going on an evening walk or bike ride with her family. [name] expressed concerns that she at times craves order in her house and she fears it will become compulsive. She shared about how cleaning and organizing helps her to feel relief. [name] shared that she would like to continue taking a break from EMDR, as it is emotionally very exhausting.   Suicidal/Homicidal: Nowithout intent/plan  Therapist Response: LCSW utilized supportive counseling techniques throughout the session in order to validate emotions and encourage open expression of emotion. LCSW and [name] explored whether her cleaning impulses were healthy or not; LCSW encouraged her to enjoy that feeling but to be aware of when it starts to cause problems in her life. LCSW assigned "homework" of journaling to re-connect to feelings and one expressive arts project where [name] has to make a mess.  Plan: Return again in 2 weeks.   Nilda Simmer, LCSW 10/25/2017

## 2017-11-07 ENCOUNTER — Ambulatory Visit: Payer: Self-pay | Admitting: Licensed Clinical Social Worker

## 2017-11-07 DIAGNOSIS — F4310 Post-traumatic stress disorder, unspecified: Secondary | ICD-10-CM
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
## 2017-11-21 ENCOUNTER — Ambulatory Visit: Payer: Self-pay | Admitting: Internal Medicine

## 2017-11-21 ENCOUNTER — Encounter: Payer: Self-pay | Admitting: Internal Medicine

## 2017-11-21 ENCOUNTER — Ambulatory Visit: Payer: Self-pay | Admitting: Licensed Clinical Social Worker

## 2017-11-21 VITALS — BP 126/80 | HR 70 | Resp 12 | Ht 62.5 in | Wt 224.0 lb

## 2017-11-21 DIAGNOSIS — F329 Major depressive disorder, single episode, unspecified: Secondary | ICD-10-CM

## 2017-11-21 DIAGNOSIS — F32A Depression, unspecified: Secondary | ICD-10-CM

## 2017-11-21 DIAGNOSIS — R7303 Prediabetes: Secondary | ICD-10-CM

## 2017-11-21 DIAGNOSIS — F419 Anxiety disorder, unspecified: Secondary | ICD-10-CM

## 2017-11-21 DIAGNOSIS — F4310 Post-traumatic stress disorder, unspecified: Secondary | ICD-10-CM

## 2017-11-21 MED ORDER — BUPROPION HCL ER (XL) 150 MG PO TB24: ORAL_TABLET | ORAL | 2 refills | Status: DC

## 2017-11-21 MED ORDER — CLONAZEPAM 1 MG PO TABS: ORAL_TABLET | ORAL | 0 refills | Status: DC

## 2017-11-21 NOTE — Progress Notes (Signed)
   Subjective:    Patient ID: Kathy Curry, female    DOB: [birth year], 37 y.o.   MRN: [id number]  HPI   1.  Prediabetes/Weight:  Does have a stationary bike now.  Uses it daily    2.  Depression:  Having lots of thoughts of dying.  Not actively killing herself, but weird thoughts of what would be the perfect death for her without harming her kids.  Some anxiety as well.   Does have difficulties with energy and productivity.  Gets lost in her thoughts when she needs to get things done. Sleeping a bit better.   Very worried about gaining weight if takes a medication.  Natosha recommended talking with me about starting something for depression.   No seizure history   Current Meds  Medication Sig  . ferrous gluconate (FERGON) 324 MG tablet 1 tab by mouth with half and orange twice daily (Patient taking differently: 1 tab every other day)  . hydroxychloroquine (PLAQUENIL) 200 MG tablet Take 200 mg by mouth 2 (two) times daily.  . metFORMIN (GLUCOPHAGE) 500 MG tablet Take 1 tablet (500 mg total) by mouth 2 (two) times daily with a meal.    Allergies  Allergen Reactions  . Meat Extract Rash    Also, mouth ulcers.  Has tested negative with allergist/rheumatologist, however.  Generally eats vegetarian diet due to this.    Review of Systems     Objective:   Physical Exam Tearful at times Weight back down by 3 lbs Lungs:  CTA CV: RRR without murmur or rub.   LE:  No edema       Assessment & Plan:  1.  Prediabetes:  Able to get restarted on bike with physical activity.  Weight about the same.  Continue Metformin  2. Depression/Anxiety/PTSD:  Start Bupropion XL 150 mg daily with Clonazepam 1 mg to be taken 1/2 to 1 tab up to twice daily as needed for increased anxiety.  Discussed 2nd med will be only for short term as gets started on Bupropion. Follow up in 1 week.

## 2017-11-22 NOTE — Progress Notes (Signed)
   THERAPY PROGRESS NOTE  Session Time: 60min  Participation Level: Active  Behavioral Response: Neat and Well GroomedAlertEuthymic  Type of Therapy: Individual Therapy  Treatment Goals addressed: Coping  Interventions: Strength-based and Supportive  Summary: Kathy PacerDulce V Curry is a 37 y.o. female who presents with a euthymic mood and appropriate affect, fluctuating to depressed mood at times during the session. She completed a PHQ-9 and scored a 9, indicating mild depressive symptoms. She completed a GAD-7 and scored an 18, indicating severe anxiety symptoms. During the screening, she indicated that she is having frequent thoughts of death. She denied any intent to harm herself and denied a plan, but shared that the thoughts are distressing. She reluctantly agreed that she would consult with Dr Delrae AlfredMulberry about an antidepressant to address her mental health symptoms. Kathy Curry shared that her mother-in-law will be coming to stay with her family this week and she has created a plan with her husband to manage the stress and set firmer boundaries.  Suicidal/Homicidal: Yeswithout intent/plan  Therapist Response: LCSW utilized supportive counseling techniques throughout the session in order to validate emotions and encourage open expression of emotion. LCSW administered a PHQ-9 in order to assess the level of current depressive symptoms. LCSW administered a GAD-7 in order to assess the current level of anxiety symptoms. LCSW completed a verbal risk assessment with Kathy Curry due to recurrent thoughts of death. LCSW strongly encouraged her to consult with Dr. Delrae AlfredMulberry about an antidepressant today at her visit.   Plan: Return again in 2 weeks.    Nilda Simmeratosha Talor Desrosiers, LCSW 11/22/2017

## 2017-11-28 ENCOUNTER — Encounter: Payer: Self-pay | Admitting: Internal Medicine

## 2017-11-28 ENCOUNTER — Ambulatory Visit: Payer: Self-pay | Admitting: Internal Medicine

## 2017-11-28 VITALS — BP 122/78 | HR 74 | Resp 12 | Ht 62.5 in

## 2017-11-28 DIAGNOSIS — F32A Depression, unspecified: Secondary | ICD-10-CM

## 2017-11-28 DIAGNOSIS — F329 Major depressive disorder, single episode, unspecified: Secondary | ICD-10-CM

## 2017-11-28 DIAGNOSIS — G4489 Other headache syndrome: Secondary | ICD-10-CM

## 2017-11-28 NOTE — Progress Notes (Signed)
   Subjective:    Patient ID: Kathy Curry, female    DOB: 05/31/1981, 37 y.o.   MRN: [id number]  HPI   Depression:  Having a headache since she started the Wellbutrin XL 150 mg.  Describes the headache as pressure.  Has some light sensitivity with this.  Has had nausea and vomiting with a worse headache after her exercise each day. She is taking the med in the morning.  She had decreased anxiety and teeth grinding/clenching--jaw pain has decreased. She is not thinking about death and dying as much as well.  Not thinking about trauma so much in past week as well.  Current Meds  Medication Sig  . buPROPion (WELLBUTRIN XL) 150 MG 24 hr tablet 1 tab by mouth daily in the morning.  . ferrous gluconate (FERGON) 324 MG tablet 1 tab by mouth with half and orange twice daily (Patient taking differently: 1 tab every other day)  . hydroxychloroquine (PLAQUENIL) 200 MG tablet Take 200 mg by mouth 2 (two) times daily.  . metFORMIN (GLUCOPHAGE) 500 MG tablet Take 1 tablet (500 mg total) by mouth 2 (two) times daily with a meal.    Allergies  Allergen Reactions  . Meat Extract Rash    Also, mouth ulcers.  Has tested negative with allergist/rheumatologist, however.  Generally eats vegetarian diet due to this.    Review of Systems     Objective:   Physical Exam NAD HEENT:  PERRL, EOMI, discs sharp, TMs pearly gray.  Throat without injection Neck:  Supple, No adenopathy Chest:  CTA CV:  RRR without murmur or rub.  Radial and DP pulses normal and equal Abd:  S, NT, No HSM or mass, + BS Neuro:  A & O x 3, CN  2-12 grossly intact.  DTRs 2+/4, Motor 5/5, sensory grossly normal.  Normal gait.      Assessment & Plan:  1.  Depression/Anxiety/PTSD:  Peri JeffersonGood effects already from Bupropion, but side effects as well.   Agreeable to decreasing Bupropion to 1/2 tab in the morning to see if headaches improve.  To stay on dose if headaches resolve and wait days to weeks before trying to increase back to 1  daily. To call a report in 1 week Has appt in August

## 2017-11-28 NOTE — Patient Instructions (Signed)
Decrease Bupropion to 1/2 tab by mouth in the morning.  If you can stay on this and see if your headache improves. If it does, stay on it and wait a few days to weeks and then increase to 1/2 tab twice daily and then wait to see if headaches recurs, but then also gradually improves. Call a report to me in 1 weeks.

## 2017-11-30 ENCOUNTER — Telehealth: Payer: Self-pay

## 2017-11-30 NOTE — Telephone Encounter (Signed)
See if she can wait about 1 week after the headache goes away and restart the 1/2 tab daily in the morning.  If the headache recurs and is too bad, then just stop and call to let us know.

## 2017-11-30 NOTE — Telephone Encounter (Signed)
Patient called to inform Dr. Delrae AlfredMulberry that she had stopped the Bupropion today due to her continuing to have a headache. States Dr. Delrae AlfredMulberry asked her to try to keep taking it to see if headache would get better, but patient states she can not take this headache anymore.  To Dr. Delrae AlfredMulberry to advise.

## 2017-12-05 ENCOUNTER — Ambulatory Visit: Payer: Self-pay | Admitting: Licensed Clinical Social Worker

## 2017-12-05 DIAGNOSIS — F4310 Post-traumatic stress disorder, unspecified: Secondary | ICD-10-CM
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
## 2017-12-06 NOTE — Telephone Encounter (Signed)
Dr. Delrae AlfredMulberry spoke with patient.

## 2017-12-19 ENCOUNTER — Ambulatory Visit (INDEPENDENT_AMBULATORY_CARE_PROVIDER_SITE_OTHER): Payer: Self-pay | Admitting: Licensed Clinical Social Worker

## 2017-12-19 DIAGNOSIS — F4310 Post-traumatic stress disorder, unspecified: Secondary | ICD-10-CM
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
## 2018-01-02 ENCOUNTER — Other Ambulatory Visit: Payer: Self-pay | Admitting: Licensed Clinical Social Worker

## 2018-01-09 ENCOUNTER — Ambulatory Visit: Payer: Self-pay | Admitting: Licensed Clinical Social Worker

## 2018-01-09 DIAGNOSIS — F4310 Post-traumatic stress disorder, unspecified: Secondary | ICD-10-CM
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
## 2018-01-23 ENCOUNTER — Ambulatory Visit: Payer: Self-pay | Admitting: Internal Medicine

## 2018-02-06 ENCOUNTER — Other Ambulatory Visit: Payer: Self-pay | Admitting: Licensed Clinical Social Worker

## 2018-03-06 ENCOUNTER — Encounter: Payer: Self-pay | Admitting: Internal Medicine

## 2018-03-06 ENCOUNTER — Ambulatory Visit: Payer: Self-pay | Admitting: Internal Medicine

## 2018-03-06 VITALS — BP 118/82 | HR 68 | Resp 12 | Ht 62.5 in | Wt 229.0 lb

## 2018-03-06 DIAGNOSIS — F32A Depression, unspecified: Secondary | ICD-10-CM

## 2018-03-06 DIAGNOSIS — F329 Major depressive disorder, single episode, unspecified: Secondary | ICD-10-CM

## 2018-03-06 DIAGNOSIS — J069 Acute upper respiratory infection, unspecified: Secondary | ICD-10-CM

## 2018-03-06 MED ORDER — FLUOXETINE HCL 10 MG PO TABS: 10.0000 mg | ORAL_TABLET | Freq: Every day | ORAL | 2 refills | Status: DC

## 2018-03-06 NOTE — Progress Notes (Signed)
   Subjective:    Patient ID: Kathy Curry, female    DOB: 01/14/1981, 37 y.o.   MRN: [id number]  HPI  1.  Depression:  Crying a lot.  No active suicidal thoughts.  Did not tolerate bupropion with headaches even with very low dose, though did obtain benefit with depressive symptoms. Lot of concern over people dealing with significant issues coming [facility] and being able to support them with their needs.  2.  Episode of prolonged crying about a week ago.  The next day at work, a man came in to her workplace smelling strongly of cigarette smoke and developed cough.  Has not been quite able to shake, but improving.  No fever, dyspnea, chest discomfort.  Current Meds  Medication Sig  . ferrous gluconate (FERGON) 324 MG tablet 1 tab by mouth with half and orange twice daily (Patient taking differently: 1 tab every other day)  . hydroxychloroquine (PLAQUENIL) 200 MG tablet Take 200 mg by mouth 2 (two) times daily.  . metFORMIN (GLUCOPHAGE) 500 MG tablet Take 1 tablet (500 mg total) by mouth 2 (two) times daily with a meal.    Allergies  Allergen Reactions  . Meat Extract Rash    Also, mouth ulcers.  Has tested negative with allergist/rheumatologist, however.  Generally eats vegetarian diet due to this.   Review of Systems     Objective:   Physical Exam  Tearful at times HEENT:  PERRL, EOMI, TMs pearly gray, throat without injection.  Nasal mucosa somewhat swollen with clear discharge. Neck:  Supple, No adenopathy Chest:  CTA CV:  RRR without murmur or rub.  Radial pulses normal and equal.      Assessment & Plan:  1.  Depression/PTSD:  Willing to try Fluoxetine 10 mg daily for one week, follow up with me at that point and likely increase to 20 mg. Encouraged her to get back with Samul DadaN. Knight, LCSW for counseling purposes.  She did find it helpful.  Just overwhelmed right now.  2.  URI/possibly allergy symptoms:  To try Allegra or other 24 hour antihistamine.

## 2018-03-13 ENCOUNTER — Encounter: Payer: Self-pay | Admitting: Internal Medicine

## 2018-03-13 ENCOUNTER — Ambulatory Visit: Payer: Self-pay | Admitting: Internal Medicine

## 2018-03-13 VITALS — BP 128/80 | HR 74 | Resp 12 | Ht 62.5 in | Wt 225.0 lb

## 2018-03-13 DIAGNOSIS — F32A Depression, unspecified: Secondary | ICD-10-CM | POA: Insufficient documentation

## 2018-03-13 DIAGNOSIS — F329 Major depressive disorder, single episode, unspecified: Secondary | ICD-10-CM

## 2018-03-13 MED ORDER — FLUOXETINE HCL 20 MG PO TABS: ORAL_TABLET | ORAL | 11 refills | Status: DC

## 2018-03-13 NOTE — Progress Notes (Signed)
   Subjective:    Patient ID: Kathy BELONGIA, female    DOB: 1981-05-26, 37 y.o.   MRN: [id number]  HPI  1.  Depression:  Has taken Fluoxetine 10 mg daily for 7 days. Does not really feel any differently.  No suicidal ideation.  No side effects.   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   Still with short lived episodes of shortness of breath--just has to breathe a bit deeper for a few seconds and then gone.  She has not paid attention as to whether she is anxious or stressed at the time.  Current Meds  Medication Sig  . ferrous gluconate (FERGON) 324 MG tablet 1 tab by mouth with half and orange twice daily (Patient taking differently: 1 tab every other day)  . FLUoxetine (PROZAC) 10 MG tablet Take 1 tablet (10 mg total) by mouth daily.  . hydroxychloroquine (PLAQUENIL) 200 MG tablet Take 200 mg by mouth 2 (two) times daily.  . metFORMIN (GLUCOPHAGE) 500 MG tablet Take 1 tablet (500 mg total) by mouth 2 (two) times daily with a meal.    Allergies  Allergen Reactions  . Meat Extract Rash    Also, mouth ulcers.  Has tested negative with allergist/rheumatologist, however.  Generally eats vegetarian diet due to this.    Review of Systems     Objective:   Physical Exam Appears a bit down, but not tearful as last visit.       Assessment & Plan:  Depression:  Tolerating Fluoxetine fine.  Increase to 20 mg daily.  Follow up in 6 weeks.  To call if the episodes of shortness of breath worsen. See last visit.

## 2018-04-26 ENCOUNTER — Ambulatory Visit (INDEPENDENT_AMBULATORY_CARE_PROVIDER_SITE_OTHER): Payer: Self-pay | Admitting: Internal Medicine

## 2018-04-26 ENCOUNTER — Encounter: Payer: Self-pay | Admitting: Internal Medicine

## 2018-04-26 VITALS — BP 122/84 | HR 76 | Ht 62.5 in | Wt 229.0 lb

## 2018-04-26 DIAGNOSIS — F329 Major depressive disorder, single episode, unspecified: Secondary | ICD-10-CM

## 2018-04-26 DIAGNOSIS — F32A Depression, unspecified: Secondary | ICD-10-CM

## 2018-04-26 MED ORDER — FLUOXETINE HCL 10 MG PO TABS: ORAL_TABLET | ORAL | 11 refills | Status: DC

## 2018-04-26 NOTE — Progress Notes (Signed)
   Subjective:    Patient ID: Kathy Curry, female    DOB: September 19, 1980, 37 y.o.   MRN: [id number]  HPI  1.  Depression:  Was taking Fluoxetine at bedtime for some reason, but feels it may have been causing problems with sleep.  Took for about 2 months.  Quit about 1 week ago as was in [employer] and just couldn't concentrate.  Never went up to the 20 mg cap.  She does not want to take a capsule. She is sleeping better since stopped the medication.  Current Meds  Medication Sig  . hydroxychloroquine (PLAQUENIL) 200 MG tablet Take 200 mg by mouth 2 (two) times daily.  . metFORMIN (GLUCOPHAGE) 500 MG tablet Take 1 tablet (500 mg total) by mouth 2 (two) times daily with a meal.    Allergies  Allergen Reactions  . Meat Extract Rash    Also, mouth ulcers.  Has tested negative with allergist/rheumatologist, however.  Generally eats vegetarian diet due to this.   Review of Systems     Objective:   Physical Exam  Appears tired Lungs:  CTA CV:  RRR without murmur or rub.  Radial pulses normal and equal     Assessment & Plan:  Depression/PTSD:  Restart fluoxetine, which she does actually think helped with depressive symptoms, in the morning with breakfast at 10 mg daily.  Increase to 20 mg daily after 7 days if sleeping okay Follow up in 2 months with A1C. Call before stopping med in future

## 2018-06-13 ENCOUNTER — Other Ambulatory Visit: Payer: Self-pay

## 2018-06-13 MED ORDER — METFORMIN HCL 500 MG PO TABS: 500.0000 mg | ORAL_TABLET | Freq: Two times a day (BID) | ORAL | 11 refills | Status: DC

## 2018-06-17 ENCOUNTER — Encounter: Payer: Self-pay | Admitting: Internal Medicine

## 2018-06-17 ENCOUNTER — Ambulatory Visit: Payer: Self-pay | Admitting: Internal Medicine

## 2018-06-17 VITALS — BP 124/80 | HR 78 | Temp 97.7°F | Resp 12 | Ht 62.5 in | Wt 228.0 lb

## 2018-06-17 DIAGNOSIS — B9789 Other viral agents as the cause of diseases classified elsewhere: Secondary | ICD-10-CM

## 2018-06-17 DIAGNOSIS — J069 Acute upper respiratory infection, unspecified: Secondary | ICD-10-CM

## 2018-06-17 DIAGNOSIS — B002 Herpesviral gingivostomatitis and pharyngotonsillitis: Secondary | ICD-10-CM

## 2018-06-17 DIAGNOSIS — H66002 Acute suppurative otitis media without spontaneous rupture of ear drum, left ear: Secondary | ICD-10-CM

## 2018-06-17 MED ORDER — AMOXICILLIN-POT CLAVULANATE 875-125 MG PO TABS: 1.0000 | ORAL_TABLET | Freq: Two times a day (BID) | ORAL | 0 refills | Status: DC

## 2018-06-17 MED ORDER — FLUCONAZOLE 150 MG PO TABS: ORAL_TABLET | ORAL | 0 refills | Status: DC

## 2018-06-17 MED ORDER — ACYCLOVIR 400 MG PO TABS: ORAL_TABLET | ORAL | 0 refills | Status: DC

## 2018-06-17 NOTE — Progress Notes (Signed)
   Subjective:    Patient ID: Kathy Curry, female    DOB: 1980-07-18, 38 y.o.   MRN: [id number]  HPI   Became ill 6 days ago with congestion and cough, myalgias.  Felt she was doing better about 3 days ago.  Started with fever 2 days ago, though has not checked her temp.  Also with chills intermittently.   She developed cold sores on her left lower lip yesterday.   No fever since yesterday afternoon.   Her left ear with pain with coughing and sneezing or blowing nose.  Mild sore throat.   Drinking fine.  Decreased appetite.   Urination is unchanged.  No nausea or vomiting.   Did have diarrhea yesterday and day before, but that has resolved.   Current Meds  Medication Sig  . FLUoxetine (PROZAC) 10 MG tablet 2 tabs by mouth every morning  . hydroxychloroquine (PLAQUENIL) 200 MG tablet Take 200 mg by mouth 2 (two) times daily.  . metFORMIN (GLUCOPHAGE) 500 MG tablet Take 1 tablet (500 mg total) by mouth 2 (two) times daily with a meal.   Allergies  Allergen Reactions  . Meat Extract Rash    Also, mouth ulcers.  Has tested negative with allergist/rheumatologist, however.  Generally eats vegetarian diet due to this.   Review of Systems     Objective:   Physical Exam  Congested cough, otherwise, NAD HEENT:  PERRL EOMI, right TM dull, but without erythema.  L TM erythematous at superior portion and dull below. Throat without injection.  MMM Neck:  Supple, No adenopathy Chest:  CTA CV:  RRR without murmur or rub.  Radial pulses normal and equal Abd:  S, NT, + BS Skin:  No rash      Assessment & Plan:  1.  Viral URI:  Supportive care with cold remedies/antipyretics discussed in patient instructions Cool mist humidifier. Drink water.  2. Secondary left OM:  Augmentin 875/125 mg twice daily for 10 days. Rx for Fluconazole given as well should she develop yeast infection.  To take at end of Augmentin course.  3.  Herpetic outbreak of lips:  Not responding to topical.   Acyclovir 400 mg 3 times daily for 7 days.

## 2018-06-17 NOTE — Patient Instructions (Signed)
Okay to continue with Robitussin, but would switch to PE or CF version. Otherwise, consider Dayquil for daytime symptom relief and Nyquil for bedtime. No extra Tylenol if use Dayquil/Nyquil Ok to use ibuprofen with these cold remedies.

## 2018-06-28 ENCOUNTER — Encounter: Payer: Self-pay | Admitting: Internal Medicine

## 2018-06-28 ENCOUNTER — Ambulatory Visit: Payer: Self-pay | Admitting: Internal Medicine

## 2018-06-28 VITALS — BP 112/78 | HR 70 | Resp 12 | Ht 62.5 in | Wt 230.0 lb

## 2018-06-28 DIAGNOSIS — B002 Herpesviral gingivostomatitis and pharyngotonsillitis: Secondary | ICD-10-CM

## 2018-06-28 DIAGNOSIS — F4310 Post-traumatic stress disorder, unspecified: Secondary | ICD-10-CM

## 2018-06-28 DIAGNOSIS — R7303 Prediabetes: Secondary | ICD-10-CM

## 2018-06-28 DIAGNOSIS — F329 Major depressive disorder, single episode, unspecified: Secondary | ICD-10-CM

## 2018-06-28 DIAGNOSIS — F32A Depression, unspecified: Secondary | ICD-10-CM

## 2018-06-28 MED ORDER — ACYCLOVIR 400 MG PO TABS: ORAL_TABLET | ORAL | 0 refills | Status: DC

## 2018-06-28 NOTE — Progress Notes (Signed)
   Subjective:   Patient ID: Kathy Curry, female    DOB: 08-Dec-1980, 38 y.o.   MRN: [id number]  HPI   1.  Depression/PTSD:  Has been taking 20 mg of Fluoxetine daily save for 2 days for headaches and stopped the med. She has restarted-apparently was a week ago.  Tolerating well since.  Has had 2 episodes of panic.  She feels she is able to self calm.   Sleeping well and rested in morning. Crying episodes have stopped.  Gabo and her children have noted the change with decreased irritability.    2.  Sore on left lower lip--cold sore type feeling.    Current Meds  Medication Sig  . FLUoxetine (PROZAC) 10 MG tablet 2 tabs by mouth every morning  . hydroxychloroquine (PLAQUENIL) 200 MG tablet Take 200 mg by mouth 2 (two) times daily.  . metFORMIN (GLUCOPHAGE) 500 MG tablet Take 1 tablet (500 mg total) by mouth 2 (two) times daily with a meal.    Allergies  Allergen Reactions  . Meat Extract Rash    Also, mouth ulcers.  Has tested negative with allergist/rheumatologist, however.  Generally eats vegetarian diet due to this.      Review of Systems    Objective:  Physical Exam NAD Left lower lip with redness and shallow scabbed lesion   Assessment & Plan:  1.  Depression/PTSD:  CPM with Fluoxetine 20 mg daily.  2.  Cold sore/oral herpes:  Acyclovir 400 mg 3 times daily for 7 days.  3.  Prediabetes:  A1C

## 2018-06-29 LAB — HGB A1C W/O EAG: Hgb A1c MFr Bld: 5.8 % — ABNORMAL HIGH (ref 4.8–5.6)

## 2018-10-14 NOTE — Telephone Encounter (Signed)
error 

## 2018-10-25 ENCOUNTER — Telehealth (INDEPENDENT_AMBULATORY_CARE_PROVIDER_SITE_OTHER): Payer: Self-pay | Admitting: Internal Medicine

## 2018-10-25 ENCOUNTER — Other Ambulatory Visit: Payer: Self-pay

## 2018-10-25 DIAGNOSIS — R7303 Prediabetes: Secondary | ICD-10-CM

## 2018-10-25 DIAGNOSIS — F4310 Post-traumatic stress disorder, unspecified: Secondary | ICD-10-CM

## 2018-10-25 DIAGNOSIS — F32A Depression, unspecified: Secondary | ICD-10-CM

## 2018-10-25 DIAGNOSIS — F329 Major depressive disorder, single episode, unspecified: Secondary | ICD-10-CM

## 2018-10-25 NOTE — Progress Notes (Signed)
    Subjective:    Patient ID: Kathy Curry, female   DOB: August 26, 1980, 38 y.o.   MRN: [id number]   HPI   1.  Depression/trauma:  Maybe missing Fluoxetine 20 mg 3 times weekly.  Forgets as puts by her bed instead of with her breakfast.   With COVID19, she is having more trouble with depression and PTSD, but realizes she is not taking her medication regularly. Sleeping better.     2.  Obesity/prediabetes:  Her weight is stable while staying at home.  She is trying to be more active.    3.  Sicca Syndrome:  Back on Hydroxychloroquine--was off due to delayed office visits with eye specialist.  Feeling better back on med.  Current Meds  Medication Sig  . ferrous gluconate (FERGON) 324 MG tablet 1 tab by mouth with half and orange twice daily  . FLUoxetine (PROZAC) 10 MG tablet 2 tabs by mouth every morning  . hydroxychloroquine (PLAQUENIL) 200 MG tablet Take 200 mg by mouth 2 (two) times daily.  . metFORMIN (GLUCOPHAGE) 500 MG tablet Take 1 tablet (500 mg total) by mouth 2 (two) times daily with a meal.   Allergies  Allergen Reactions  . Meat Extract Rash    Also, mouth ulcers.  Has tested negative with allergist/rheumatologist, however.  Generally eats vegetarian diet due to this.     Review of Systems    Objective:   LMP 12/09/2015 (Exact Date) Comment: Still on period  Physical Exam   Looks well   Assessment & Plan   1.  Depression/PTSD:  Encouraged her to put her medication with her breakfast foods so she does not forget.  Have really yet to see how she does long term on daily use of Fluoxetine.  2.  Obesity/prediabetes:  To continue to work on diet and physical activity.  Continues also on Metformin, which she tolerates fine.    3.  Arthritis/sicca symptoms/rashes:  Still no definitive diagnosis, but does better on Plaquenil.  As per Rheumatology

## 2019-01-22 ENCOUNTER — Other Ambulatory Visit (HOSPITAL_COMMUNITY): Payer: Self-pay | Admitting: *Deleted

## 2019-01-31 ENCOUNTER — Ambulatory Visit: Payer: Self-pay | Admitting: Internal Medicine

## 2019-03-10 ENCOUNTER — Telehealth: Payer: Self-pay | Admitting: Internal Medicine

## 2019-03-10 NOTE — Telephone Encounter (Signed)
Patient has been notified requested medical records are available to pick up.

## 2019-07-07 ENCOUNTER — Ambulatory Visit: Payer: Self-pay | Admitting: Internal Medicine

## 2019-07-14 ENCOUNTER — Telehealth: Payer: Self-pay | Admitting: Internal Medicine

## 2019-07-14 ENCOUNTER — Other Ambulatory Visit: Payer: Self-pay | Admitting: Internal Medicine

## 2019-07-14 NOTE — Telephone Encounter (Signed)
Patient called requesting Rx on metFORMIN (GLUCOPHAGE) 500 MG tablet patient stated took las pill yesterday and will like to know if has to wait until 07/27/2019 to be authorized with more Rx's or if can get some before appt..   Please advise.

## 2019-07-14 NOTE — Telephone Encounter (Signed)
Rx was filled by e-scribe

## 2019-07-22 ENCOUNTER — Telehealth: Payer: Self-pay | Admitting: Internal Medicine

## 2019-07-22 NOTE — Telephone Encounter (Signed)
Patient called stating at her job they are vaccinating for COVID 19 and patient will like to know if there will be any contraindications between the vaccine and metFORMIN (GLUCOPHAGE) 500 MG tablet and hydroxychloroquine (PLAQUENIL) 200 MG tablet; medications that patient takes daily.  Please advise.

## 2019-07-23 NOTE — Telephone Encounter (Signed)
Spoke with patient. Informed her Dr. Delrae Alfred said yes she should get the vaccine and no it will not cause issues with the medication she is taking. Patient verbalized understanding.

## 2019-07-25 ENCOUNTER — Other Ambulatory Visit: Payer: Self-pay

## 2019-07-25 ENCOUNTER — Encounter: Payer: Self-pay | Admitting: Internal Medicine

## 2019-07-25 ENCOUNTER — Ambulatory Visit (INDEPENDENT_AMBULATORY_CARE_PROVIDER_SITE_OTHER): Payer: Self-pay | Admitting: Internal Medicine

## 2019-07-25 VITALS — BP 120/80 | HR 74 | Resp 12 | Ht 62.5 in | Wt 213.0 lb

## 2019-07-25 DIAGNOSIS — R7303 Prediabetes: Secondary | ICD-10-CM

## 2019-07-25 DIAGNOSIS — F32A Depression, unspecified: Secondary | ICD-10-CM

## 2019-07-25 DIAGNOSIS — F329 Major depressive disorder, single episode, unspecified: Secondary | ICD-10-CM

## 2019-07-25 DIAGNOSIS — N644 Mastodynia: Secondary | ICD-10-CM

## 2019-07-25 DIAGNOSIS — Z79899 Other long term (current) drug therapy: Secondary | ICD-10-CM

## 2019-07-25 MED ORDER — METFORMIN HCL 500 MG PO TABS: 500.0000 mg | ORAL_TABLET | Freq: Two times a day (BID) | ORAL | 3 refills | Status: DC

## 2019-07-25 NOTE — Progress Notes (Signed)
    Subjective:    Patient ID: Kathy Curry, female   DOB: [birth year], 39 y.o.   MRN: [id number]   HPI   1.  Prediabetes/Purposeful weight loss:  Has been exercising more since May.  Also, eating a low carb and increased protein diet.  Has lost 17 lbs since January.    2.  Depression:  Feels she is doing better.  Back in August, started seeing a therapist.  Meeting weekly.   Has learned skills to decrease panic in situations still difficult for her.    3.  Right breast pain:  More in superior part of breast and feels like someone is crushing her breast.  Can last for 2 days.   Stretching or movement does not help.  Feels the pain is in the breast tissue itself--when in the shower during time when having pain, feels like the tissue is hard in the superior aspect of her breast.  She has not paid attention to her cycle (does not have a uterus, but has leg aches at same time she normally would have had a period.) She cannot say the pain occurs in any regular fashion with legs aching. Has had episodes 3 times since beginning of 2021.     Current Meds  Medication Sig  . hydroxychloroquine (PLAQUENIL) 200 MG tablet Take 200 mg by mouth 2 (two) times daily.  . metFORMIN (GLUCOPHAGE) 500 MG tablet TAKE 1 TABLET BY MOUTH TWICE DAILY WITH MEALS   Allergies  Allergen Reactions  . Meat Extract Rash    Also, mouth ulcers.  Has tested negative with allergist/rheumatologist, however.  Generally eats vegetarian diet due to this.     Review of Systems    Objective:   BP 120/80 (BP Location: Left Arm, Patient Position: Sitting, Cuff Size: Large)   Pulse 74   Resp 12   Ht 5' 2.5" (1.588 m)   Wt 213 lb (96.6 kg)   LMP 12/09/2015 (Exact Date) Comment: Still on period  BMI 38.34 kg/m   Physical Exam  NAD Appears neat and happy. Lungs:  CTA Breasts:  Thickening and hardening of breast tissue in superior lateral quadrant of right breast.  Mildly tender over this area today.  No overlying  skin changes.  No focal mass, skin dimpling or nipple discharge and no axillary adenopathy of either breast. CV:  RRR without murmur or rub.  Radial and DP pulses normal and equal. Abd:  S, NT, No HSM or mass.  + BS    Assessment & Plan  1.  Weight issues and prediabetes:  Seeing significant improvement at this point with dietary change and physical activity.  A1C and CMP  2. Depression:  Doing well.  Panic/PTSD/depressive symptoms improved with current counselor. Discussed Danton Clap will be joining [place] in March  3.  Right breast thickening and tenderness:  Mammogram/ultrasound of breast.  Suspect fibrocystic changes.  4.  Hx of inflammatory arthritis/Sjogrens Syndrome:  Will need to check on last eye exam as she is on plaquenil for this.   Not clear she is following with Rheumatology regularly since pandemic.

## 2019-07-26 LAB — COMPREHENSIVE METABOLIC PANEL
ALT: 23 IU/L (ref 0–32)
AST: 18 IU/L (ref 0–40)
Albumin/Globulin Ratio: 1.7 (ref 1.2–2.2)
Albumin: 4.3 g/dL (ref 3.8–4.8)
Alkaline Phosphatase: 72 IU/L (ref 39–117)
BUN/Creatinine Ratio: 13 (ref 9–23)
BUN: 7 mg/dL (ref 6–20)
Bilirubin Total: 0.3 mg/dL (ref 0.0–1.2)
CO2: 20 mmol/L (ref 20–29)
Calcium: 9.4 mg/dL (ref 8.7–10.2)
Chloride: 105 mmol/L (ref 96–106)
Creatinine, Ser: 0.52 mg/dL — ABNORMAL LOW (ref 0.57–1.00)
GFR calc Af Amer: 141 mL/min/{1.73_m2} (ref >59)
GFR calc non Af Amer: 122 mL/min/{1.73_m2} (ref >59)
Globulin, Total: 2.5 g/dL (ref 1.5–4.5)
Glucose: 93 mg/dL (ref 65–99)
Potassium: 4.3 mmol/L (ref 3.5–5.2)
Sodium: 141 mmol/L (ref 134–144)
Total Protein: 6.8 g/dL (ref 6.0–8.5)

## 2019-07-26 LAB — HGB A1C W/O EAG: Hgb A1c MFr Bld: 5.5 % (ref 4.8–5.6)

## 2019-08-13 ENCOUNTER — Encounter: Payer: Self-pay | Admitting: Internal Medicine

## 2019-08-13 ENCOUNTER — Ambulatory Visit (INDEPENDENT_AMBULATORY_CARE_PROVIDER_SITE_OTHER): Payer: Self-pay | Admitting: Internal Medicine

## 2019-08-13 VITALS — BP 102/72 | HR 78 | Resp 12 | Ht 62.5 in | Wt 209.5 lb

## 2019-08-13 DIAGNOSIS — M1990 Unspecified osteoarthritis, unspecified site: Secondary | ICD-10-CM

## 2019-08-13 DIAGNOSIS — R7303 Prediabetes: Secondary | ICD-10-CM

## 2019-08-13 DIAGNOSIS — R0989 Other specified symptoms and signs involving the circulatory and respiratory systems: Secondary | ICD-10-CM

## 2019-08-13 DIAGNOSIS — N644 Mastodynia: Secondary | ICD-10-CM

## 2019-08-13 DIAGNOSIS — R591 Generalized enlarged lymph nodes: Secondary | ICD-10-CM

## 2019-08-13 NOTE — Addendum Note (Signed)
Addended by: Marcelino Freestone on: 08/13/2019 03:21 PM   Modules accepted: Orders

## 2019-08-13 NOTE — Progress Notes (Signed)
    Subjective:    Patient ID: Kathy Curry, female   DOB: [birth year], 39 y.o.   MRN: [id number]   HPI   Lump in left supraclavicular area:   Felt a tenderness in her left supraclavicular area--had been exercising and noted a sore area in there left neck base and supraclavicular area.  She massaged the area and found a sore bump. The discomfort is there all the time, but has more tender when she presses on it.   No discomfort or soreness elsewhere.   No abdominal pain, fatigue, dyspnea.   She has had some right superior lateral breast discomfort for which she is to have a mammogram/ultrasound through [facility]. She may have had some folliculitis of left scalp prior to enlarged bump in neck.  Inflammatory arthritis:    Has not been back to Rheum since 2019, but did go for an eye check with [facility] she believes in November 2020 for her chronic use of Plaquenil.   Right Breast tenderness:    Not able to make a connection with [facility] for scholarship.  Current Meds  Medication Sig  . hydroxychloroquine (PLAQUENIL) 200 MG tablet Take 200 mg by mouth 2 (two) times daily.  . metFORMIN (GLUCOPHAGE) 500 MG tablet Take 1 tablet (500 mg total) by mouth 2 (two) times daily with a meal.   Allergies  Allergen Reactions  . Meat Extract Rash    Also, mouth ulcers.  Has tested negative with allergist/rheumatologist, however.  Generally eats vegetarian diet due to this.     Review of Systems    Objective:   BP 102/72 (BP Location: Left Arm, Patient Position: Sitting, Cuff Size: Large)   Pulse 78   Resp 12   Ht 5' 2.5" (1.588 m)   Wt 209 lb 8 oz (95 kg)   LMP 12/09/2015 (Exact Date) Comment: Still on period  BMI 37.71 kg/m   Physical Exam  NAD HEENT:  No skin lesions on left scalp, neck.  Throat without injection.   Chest:  CTA CV:  RRR without murmur or rub. Lymph:  1 cm soft and moveable lymph node over medial clavicle on left.  Mildly tender.  No overlying  swelling or erythema.   No other anterior or posterior cervical, supraclavicular, axillary, epitrochlear, inguinal or popliteal adenopathy.  She does have significant axillary breast tissue. No HSM or mass.   Assessment & Plan   1.  Tender lymph node, left medial supraclavicular area.  CBC.  To avoid any touching or manipulation.  To call if increased pain or swelling Recheck in 1 month.  2.  Right Breast pain:  Checking with [facility] to see if we can get the scholarship application to fax to them and have patients fill out here.  3.  Inflammatory arthritis on Plaquenil:  Has had eye exam.  Encouraged her to get her paperwork for [facility] filled out so she does not miss her follow up appt with Rheum and run out of Plaquenil.  4.  Prediabetes:  Went over normal A1C--much improved with weight loss.

## 2019-08-13 NOTE — Patient Instructions (Signed)
Dont touch your lymph node.

## 2019-08-14 LAB — CBC WITH DIFFERENTIAL/PLATELET
Basophils Absolute: 0.1 10*3/uL (ref 0.0–0.2)
Basos: 1 %
EOS (ABSOLUTE): 0.2 10*3/uL (ref 0.0–0.4)
Eos: 2 %
Hematocrit: 39.3 % (ref 34.0–46.6)
Hemoglobin: 13.7 g/dL (ref 11.1–15.9)
Immature Grans (Abs): 0 10*3/uL (ref 0.0–0.1)
Immature Granulocytes: 0 %
Lymphocytes Absolute: 1.8 10*3/uL (ref 0.7–3.1)
Lymphs: 20 %
MCH: 30.6 pg (ref 26.6–33.0)
MCHC: 34.9 g/dL (ref 31.5–35.7)
MCV: 88 fL (ref 79–97)
Monocytes Absolute: 0.5 10*3/uL (ref 0.1–0.9)
Monocytes: 6 %
Neutrophils Absolute: 6.2 10*3/uL (ref 1.4–7.0)
Neutrophils: 71 %
Platelets: 285 10*3/uL (ref 150–450)
RBC: 4.48 x10E6/uL (ref 3.77–5.28)
RDW: 12.7 % (ref 11.7–15.4)
WBC: 8.7 10*3/uL (ref 3.4–10.8)

## 2019-08-15 ENCOUNTER — Other Ambulatory Visit: Payer: Self-pay

## 2019-08-15 DIAGNOSIS — N6320 Unspecified lump in the left breast, unspecified quadrant: Secondary | ICD-10-CM

## 2019-08-15 DIAGNOSIS — N6310 Unspecified lump in the right breast, unspecified quadrant: Secondary | ICD-10-CM

## 2019-09-02 ENCOUNTER — Ambulatory Visit: Payer: Self-pay | Admitting: Student

## 2019-09-02 ENCOUNTER — Other Ambulatory Visit: Payer: Self-pay

## 2019-09-02 ENCOUNTER — Ambulatory Visit: Payer: Self-pay

## 2019-09-02 ENCOUNTER — Ambulatory Visit
Admission: RE | Admit: 2019-09-02 | Discharge: 2019-09-02 | Disposition: A | Discharge status: Home / Self Care | Payer: No Typology Code available for payment source | Source: Ambulatory Visit | Attending: Obstetrics and Gynecology | Admitting: Obstetrics and Gynecology

## 2019-09-02 ENCOUNTER — Encounter: Payer: Self-pay | Admitting: Student

## 2019-09-02 VITALS — BP 114/68 | Temp 97.3°F | Wt 213.0 lb

## 2019-09-02 DIAGNOSIS — N644 Mastodynia: Secondary | ICD-10-CM

## 2019-09-02 DIAGNOSIS — N6320 Unspecified lump in the left breast, unspecified quadrant: Secondary | ICD-10-CM

## 2019-09-02 DIAGNOSIS — N6310 Unspecified lump in the right breast, unspecified quadrant: Secondary | ICD-10-CM

## 2019-09-02 NOTE — Progress Notes (Signed)
Ms. Kathy Curry is a 39 y.o. female who presents to [facility] clinic today with complaint of right breast pain. Symptoms started about 6 months ago. Reports right breast & axillary pain that is sharp & intermittent. Pain is bilateral but worse in right breast. Feels like tissue is "thicker" than normal when this occurs. Denies nipple discharge.     Pap Smear: Pap not smear completed today. Last Pap smear was unknown at unknown clinic and was abnormal. Per patient has history of an abnormal Pap smear. Last Pap smear result is not available in Epic. She is s/p total hysterectomy in 2017.    Physical exam: Breasts Breasts symmetrical. No skin abnormalities bilateral breasts. No nipple retraction bilateral breasts. No nipple discharge bilateral breasts. No lymphadenopathy. No lumps palpated bilateral breasts.       Pelvic/Bimanual Pap is not indicated today    Smoking History: Patient has never smoked.   Patient Navigation: Patient education provided. Access to services provided for patient through [facility] program. No interpreter provided. No transportation provided   Colorectal Cancer Screening: Per patient has never had colonoscopy completed No complaints today.    Breast and Cervical Cancer Risk Assessment: Patient does not have family history of breast cancer, known genetic mutations, or radiation treatment to the chest before age 40. Patient does not have history of cervical dysplasia, immunocompromised, or DES exposure in-utero.  Risk Assessment    Risk Scores      09/02/2019   Last edited by: Narda Rutherford, LPN   5-year risk: 0.2 %   Lifetime risk: 5.7 %          A: [facility] exam without pap smear Complaint of breast pain & tissue changes  P: Referred patient to [facility] for a diagnostic mammogram. Appointment scheduled for later today.  Judeth Horn, NP 09/02/2019 8:42 AM

## 2019-09-10 ENCOUNTER — Other Ambulatory Visit: Payer: Self-pay

## 2019-09-10 ENCOUNTER — Encounter: Payer: Self-pay | Admitting: Internal Medicine

## 2019-09-10 ENCOUNTER — Ambulatory Visit: Payer: Self-pay | Admitting: Internal Medicine

## 2019-09-10 VITALS — BP 118/80 | HR 72 | Resp 12 | Ht 62.5 in | Wt 206.5 lb

## 2019-09-10 DIAGNOSIS — R591 Generalized enlarged lymph nodes: Secondary | ICD-10-CM

## 2019-09-10 DIAGNOSIS — R0989 Other specified symptoms and signs involving the circulatory and respiratory systems: Secondary | ICD-10-CM

## 2019-09-10 NOTE — Progress Notes (Signed)
    Subjective:    Patient ID: Kathy Curry, female   DOB: 1981/01/11, 39 y.o.   MRN: [id number]   HPI   Left supraclavicular node overlying medial left clavicle.  CBC was normal with check beginning of month. She did have evaluation of left breast/chest area.  Mammogram was normal.    Current Meds  Medication Sig  . hydroxychloroquine (PLAQUENIL) 200 MG tablet Take 200 mg by mouth 2 (two) times daily.  . metFORMIN (GLUCOPHAGE) 500 MG tablet Take 1 tablet (500 mg total) by mouth 2 (two) times daily with a meal.   Allergies  Allergen Reactions  . Pineapple   . Meat Extract Rash    Also, mouth ulcers.  Has tested negative with allergist/rheumatologist, however.  Generally eats vegetarian diet due to this.     Review of Systems    Objective:   BP 118/80 (BP Location: Left Arm, Patient Position: Sitting, Cuff Size: Normal)   Pulse 72   Resp 12   Ht 5' 2.5" (1.588 m)   Wt 206 lb 8 oz (93.7 kg)   LMP 12/09/2015 (Exact Date)   BMI 37.17 kg/m   Physical Exam 1 cm soft bean shaped lesions that is freely moveable overlying the left medial clavicle. Perhaps some mild tenderness to right of lesion.   No adenopathy elsewhere.  Assessment & Plan   Lymph node with benign characteristics on exam:  Not enlarging Recheck in 2 months.

## 2019-11-12 ENCOUNTER — Ambulatory Visit: Payer: Self-pay | Admitting: Internal Medicine

## 2019-12-12 ENCOUNTER — Ambulatory Visit: Payer: Self-pay | Admitting: Internal Medicine

## 2020-01-23 ENCOUNTER — Ambulatory Visit: Payer: Self-pay | Admitting: Internal Medicine

## 2020-02-10 ENCOUNTER — Telehealth: Payer: Self-pay

## 2020-02-10 ENCOUNTER — Ambulatory Visit (INDEPENDENT_AMBULATORY_CARE_PROVIDER_SITE_OTHER): Payer: Self-pay | Admitting: Internal Medicine

## 2020-02-10 VITALS — HR 74 | Temp 98.7°F | Resp 14

## 2020-02-10 DIAGNOSIS — J0140 Acute pansinusitis, unspecified: Secondary | ICD-10-CM

## 2020-02-10 DIAGNOSIS — J069 Acute upper respiratory infection, unspecified: Secondary | ICD-10-CM

## 2020-02-10 MED ORDER — AZITHROMYCIN 500 MG PO TABS: ORAL_TABLET | ORAL | 0 refills | Status: DC

## 2020-02-10 NOTE — Patient Instructions (Signed)
Push fluids Decongestant/antihistamin--Dayquil for day, Nyquil for night is fine

## 2020-02-10 NOTE — Progress Notes (Signed)
    Subjective:    Patient ID: Kathy Curry, female   DOB: August 01, 1980, 39 y.o.   MRN: [id number]   HPI   Presents with 1 week of cough, congestion and headaches.  Sore throat starting today.  Perhaps some posterior pharyngeal drainage.  Had COVID testing that was negative.  Fully vaccinated for COVID, but does not have card today. Husband just started with mild symptoms as well.    Current Meds  Medication Sig  . hydroxychloroquine (PLAQUENIL) 200 MG tablet Take 200 mg by mouth 2 (two) times daily.  . metFORMIN (GLUCOPHAGE) 500 MG tablet Take 1 tablet (500 mg total) by mouth 2 (two) times daily with a meal.    Allergies  Allergen Reactions  . Pineapple   . Meat Extract Rash    Also, mouth ulcers.  Has tested negative with allergist/rheumatologist, however.  Generally eats vegetarian diet due to this.     Review of Systems    Objective:   Pulse 74   Temp 98.7 F (37.1 C) (Temporal)   Resp 14   LMP 12/09/2015 (Exact Date)   SpO2 98%   Physical Exam  NAD Deep congested cough. HEENT:  PERRL, EOMI, TMs pearly gray, throat without injection or exudate.  Mild tenderness over frontal and maxillary sinuses Neck:  Supple, No adenopathy Lungs:  CTA CV:  RRR without murmur or rub.  Radial pulses normal and equal Abd:  S, NT, No HSM or mass. + BS   Assessment & Plan   Acute Sinusitis:  Azithromycin 500 mg daily for 5 days.  OTC cold remedies.  Push fluids.  Call if no improvement

## 2020-02-10 NOTE — Telephone Encounter (Signed)
Patient called stating she has cough and headache since Friday. Patient states she had a covid test done on Saturday and results came back today negative. Patient not sure what she needs to do . States she has been taking allergy medication and it is not helping. Patient states she is also having chest congestion and heaviness.

## 2020-02-11 NOTE — Telephone Encounter (Signed)
Patient came in for quick check of lungs yesterday

## 2020-03-19 ENCOUNTER — Ambulatory Visit: Payer: Self-pay | Admitting: Internal Medicine

## 2020-03-19 ENCOUNTER — Encounter: Payer: Self-pay | Admitting: Internal Medicine

## 2020-03-19 VITALS — BP 135/72 | HR 64 | Resp 14 | Ht 62.5 in | Wt 208.0 lb

## 2020-03-19 DIAGNOSIS — M3500 Sicca syndrome, unspecified: Secondary | ICD-10-CM

## 2020-03-19 DIAGNOSIS — Z23 Encounter for immunization: Secondary | ICD-10-CM

## 2020-03-19 DIAGNOSIS — R7303 Prediabetes: Secondary | ICD-10-CM

## 2020-03-19 DIAGNOSIS — M1990 Unspecified osteoarthritis, unspecified site: Secondary | ICD-10-CM

## 2020-03-19 NOTE — Progress Notes (Signed)
    Subjective:    Patient ID: Kathy Curry, female   DOB: 07/05/1980, 39 y.o.   MRN: [id number]   HPI   1.  Stress:  Resigned from [facility] [employer] under stressful circumstances with many people she has worked with leaving.  Finds herself tearful and grinding her teeth.  She is set up with a counselor, which she has found helpful.  She does not feel she needs more than this currently.  She does not have a bite block--states she actually broke off a piece of tooth  2.  Prediabetes:  Was down to 200 lbs until her stressful work conditions occurred.  Away from physical activity.    3.   Sicca syndrome/skin rash/RA:  Was out of plaquenil for a few weeks as did not have her eye exam done.  Just got back on 2 weeks ago.    4.  Sweats more easily--hair just gets wet.  Current Meds  Medication Sig   hydroxychloroquine (PLAQUENIL) 200 MG tablet Take 200 mg by mouth 2 (two) times daily.   metFORMIN (GLUCOPHAGE) 500 MG tablet Take 1 tablet (500 mg total) by mouth 2 (two) times daily with a meal.   Allergies  Allergen Reactions   Pineapple    Meat Extract Rash    Also, mouth ulcers.  Has tested negative with allergist/rheumatologist, however.  Generally eats vegetarian diet due to this.     Review of Systems    Objective:   BP 135/72 (BP Location: Right Arm, Patient Position: Sitting, Cuff Size: Normal)   Pulse 64   Resp 14   Ht 5' 2.5" (1.588 m)   Wt 208 lb (94.3 kg)   LMP 12/09/2015 (Exact Date)   BMI 37.44 kg/m   Physical Exam NAD HEENT:  PERRL, EOMI, TMs pearly gray.  Throat without injection Neck:  Supple, No adenopathy, no thyromegaly Chest:  CTA CV:  RRR without murmur or rub.  Radial and DP pulses normal and equal Abd:  S, NT, No HSM or mass, + BS LE:  No edema Skin:  no rash.   Assessment & Plan   Stress due to recent job environment that led to her resignation:  continue with counseling.  2.   Sjogrens/SICCA syndrome/Inflammatory arthritis/chronic idiopathic urticaria:   Encouraged getting eye exam every 6 months to maintain medication management with Plaquenil.  Followed at [facility] by Rheumatology and Allergy.  3.  Prediabetes:  Fasting labs in next 2 weeks, including A1C.  4.  HM:  CBC, CMP, FLP, HIV, Hepatitis C in next 2 weeks.  Encouraged COVID booster and influenza vaccine.  Td today.

## 2020-03-29 ENCOUNTER — Other Ambulatory Visit: Payer: Self-pay

## 2020-03-29 DIAGNOSIS — R7303 Prediabetes: Secondary | ICD-10-CM

## 2020-03-29 DIAGNOSIS — Z7251 High risk heterosexual behavior: Secondary | ICD-10-CM

## 2020-03-29 DIAGNOSIS — D500 Iron deficiency anemia secondary to blood loss (chronic): Secondary | ICD-10-CM

## 2020-03-29 DIAGNOSIS — R748 Abnormal levels of other serum enzymes: Secondary | ICD-10-CM

## 2020-03-30 LAB — HIV ANTIBODY (ROUTINE TESTING W REFLEX): HIV Screen 4th Generation wRfx: NONREACTIVE

## 2020-03-30 LAB — CBC WITH DIFFERENTIAL/PLATELET
Basophils Absolute: 0.1 10*3/uL (ref 0.0–0.2)
Basos: 1 %
EOS (ABSOLUTE): 0.3 10*3/uL (ref 0.0–0.4)
Eos: 4 %
Hematocrit: 40 % (ref 34.0–46.6)
Hemoglobin: 13.7 g/dL (ref 11.1–15.9)
Immature Grans (Abs): 0 10*3/uL (ref 0.0–0.1)
Immature Granulocytes: 0 %
Lymphocytes Absolute: 2.1 10*3/uL (ref 0.7–3.1)
Lymphs: 35 %
MCH: 29.8 pg (ref 26.6–33.0)
MCHC: 34.3 g/dL (ref 31.5–35.7)
MCV: 87 fL (ref 79–97)
Monocytes Absolute: 0.3 10*3/uL (ref 0.1–0.9)
Monocytes: 5 %
Neutrophils Absolute: 3.3 10*3/uL (ref 1.4–7.0)
Neutrophils: 55 %
Platelets: 265 10*3/uL (ref 150–450)
RBC: 4.6 x10E6/uL (ref 3.77–5.28)
RDW: 12.6 % (ref 11.7–15.4)
WBC: 6.1 10*3/uL (ref 3.4–10.8)

## 2020-03-30 LAB — COMPREHENSIVE METABOLIC PANEL
ALT: 19 IU/L (ref 0–32)
AST: 16 IU/L (ref 0–40)
Albumin/Globulin Ratio: 1.8 (ref 1.2–2.2)
Albumin: 4.4 g/dL (ref 3.8–4.8)
Alkaline Phosphatase: 65 IU/L (ref 44–121)
BUN/Creatinine Ratio: 15 (ref 9–23)
BUN: 8 mg/dL (ref 6–20)
Bilirubin Total: 0.4 mg/dL (ref 0.0–1.2)
CO2: 22 mmol/L (ref 20–29)
Calcium: 9.2 mg/dL (ref 8.7–10.2)
Chloride: 106 mmol/L (ref 96–106)
Creatinine, Ser: 0.55 mg/dL — ABNORMAL LOW (ref 0.57–1.00)
GFR calc Af Amer: 138 mL/min/{1.73_m2} (ref >59)
GFR calc non Af Amer: 119 mL/min/{1.73_m2} (ref >59)
Globulin, Total: 2.5 g/dL (ref 1.5–4.5)
Glucose: 94 mg/dL (ref 65–99)
Potassium: 4.1 mmol/L (ref 3.5–5.2)
Sodium: 142 mmol/L (ref 134–144)
Total Protein: 6.9 g/dL (ref 6.0–8.5)

## 2020-03-30 LAB — LIPID PANEL W/O CHOL/HDL RATIO
Cholesterol, Total: 204 mg/dL — ABNORMAL HIGH (ref 100–199)
HDL: 44 mg/dL (ref >39)
LDL Chol Calc (NIH): 136 mg/dL — ABNORMAL HIGH (ref 0–99)
Triglycerides: 134 mg/dL (ref 0–149)
VLDL Cholesterol Cal: 24 mg/dL (ref 5–40)

## 2020-03-30 LAB — HEPATITIS C ANTIBODY: Hep C Virus Ab: <0.1 s/co ratio (ref 0.0–0.9)

## 2020-03-30 LAB — HEMOGLOBIN A1C
Est. average glucose Bld gHb Est-mCnc: 108 mg/dL
Hgb A1c MFr Bld: 5.4 % (ref 4.8–5.6)

## 2020-05-10 ENCOUNTER — Ambulatory Visit (INDEPENDENT_AMBULATORY_CARE_PROVIDER_SITE_OTHER): Payer: Self-pay

## 2020-05-10 DIAGNOSIS — Z23 Encounter for immunization: Secondary | ICD-10-CM

## 2020-09-17 ENCOUNTER — Ambulatory Visit: Payer: Self-pay | Admitting: Internal Medicine

## 2020-12-10 ENCOUNTER — Telehealth: Payer: Self-pay | Admitting: Internal Medicine

## 2020-12-10 MED ORDER — METFORMIN HCL 500 MG PO TABS: 500.0000 mg | ORAL_TABLET | Freq: Two times a day (BID) | ORAL | 3 refills | Status: DC

## 2020-12-10 NOTE — Telephone Encounter (Signed)
Here in person requesting refill of Metformin

## 2020-12-22 ENCOUNTER — Other Ambulatory Visit: Payer: Self-pay

## 2021-01-28 ENCOUNTER — Ambulatory Visit: Payer: Self-pay | Admitting: Internal Medicine

## 2021-01-28 ENCOUNTER — Encounter: Payer: Self-pay | Admitting: Internal Medicine

## 2021-01-28 ENCOUNTER — Other Ambulatory Visit: Payer: Self-pay

## 2021-01-28 VITALS — BP 126/82 | HR 64 | Resp 20 | Ht 62.0 in | Wt 214.0 lb

## 2021-01-28 DIAGNOSIS — R7303 Prediabetes: Secondary | ICD-10-CM

## 2021-01-28 DIAGNOSIS — I83813 Varicose veins of bilateral lower extremities with pain: Secondary | ICD-10-CM

## 2021-01-28 DIAGNOSIS — Z79899 Other long term (current) drug therapy: Secondary | ICD-10-CM

## 2021-01-28 DIAGNOSIS — E7800 Pure hypercholesterolemia, unspecified: Secondary | ICD-10-CM

## 2021-01-28 DIAGNOSIS — Z780 Asymptomatic menopausal state: Secondary | ICD-10-CM

## 2021-01-28 MED ORDER — BLACK COHOSH 40 MG PO CAPS: ORAL_CAPSULE | ORAL | 0 refills | Status: DC

## 2021-01-28 NOTE — Patient Instructions (Addendum)
Astro glide Vagisil silk Coconut oil  [facility] [address]  [place], [place] [address] [phone] 40% Discount for [employer] Patients

## 2021-01-28 NOTE — Progress Notes (Signed)
    Subjective:    Patient ID: Kathy Curry, female   DOB: 06-22-1980, 40 y.o.   MRN: [id number]   HPI   Hot flashes:  history of hysterectomy and right oophorectomy for benign reasons.  Started having hot flashes this year.  Most of the time in the evening when not doing anything.  Not sure if just notes them more then.  Does have them during the day.  Happening maybe twice daily and can last 30-60 minutes.  Also with vaginal dryness, which is a problem with and not during intercourse.  Also at times with night sweats.  Occasionally, having difficulties with recurrent waking/insomnia.  No family history of DVT.  Nonsmoker   2.  Lump under left costal margin.  Cannot say for how long.  Not growing.  3.  Varicosities of LE:  would like to talk about graduated compression stockings.    4.  Hypercholesterolemia/prediabetes/obesity:  continues on Metformin.  Weight up a bit--6 lbs.  Is eating healthy and trying to exercise each evening.    Current Meds  Medication Sig   hydroxychloroquine (PLAQUENIL) 200 MG tablet Take 200 mg by mouth 2 (two) times daily.   metFORMIN (GLUCOPHAGE) 500 MG tablet Take 1 tablet (500 mg total) by mouth 2 (two) times daily with a meal.   Allergies  Allergen Reactions   Pineapple    Meat Extract Rash    Also, mouth ulcers.  Has tested negative with allergist/rheumatologist, however.  Generally eats vegetarian diet due to this.     Review of Systems    Objective:   BP 126/82 (BP Location: Left Arm, Patient Position: Sitting, Cuff Size: Normal)   Pulse 64   Resp 20   Ht 5\' 2"  (1.575 m)   Wt 214 lb (97.1 kg)   LMP 12/09/2015 (Exact Date)   BMI 39.14 kg/m   Physical Exam NAD HEENT:  PERRL, EOMI, TMs pearly gray Neck:  Supple, No adenopathy, no thyromegaly Chest:  CTA CV:  RRR with normal S1 andS2, No S3, S4 or murmur.  Carotid, radial and DP pulses normal and equal Abd:  S, NT, No HSM or mass, + BS.  Fatty feeling thickening under costal margin  on left.  NT LE:  No edema.  Broken superficial spider veins are prominent as are varicosities of both legs   Assessment & Plan    Menopause:  discussed treatment options.  ERT unopposed as history of hysterectomy.  Risks of breast cancer/CAD/DVT discussed.  Will start with Black Cohosh through [facility].  Vaginal lubrication options discussed  2.  Lipoma, left upper abdomen/costal margin:  follow  3.  Varicosities:  discussed compression stockings--thigh high or pantyhose.  Given order magazine and information of measurements for sizing and can order herself.  4.  Obesity/hypercholesterolemia/Prediabetes:  continues to work on diet and physical activity.  FLP, A1C  5.  Allergies/inflammatory arthritis/SICCA syndrome:  as per Rheumatology and Allergy:  CBC, CMP.  Every 6 month eye exam.

## 2021-01-29 LAB — COMPREHENSIVE METABOLIC PANEL
ALT: 20 IU/L (ref 0–32)
AST: 16 IU/L (ref 0–40)
Albumin/Globulin Ratio: 1.7 (ref 1.2–2.2)
Albumin: 4.5 g/dL (ref 3.8–4.8)
Alkaline Phosphatase: 68 IU/L (ref 44–121)
BUN/Creatinine Ratio: 10 (ref 9–23)
BUN: 6 mg/dL (ref 6–20)
Bilirubin Total: 0.3 mg/dL (ref 0.0–1.2)
CO2: 23 mmol/L (ref 20–29)
Calcium: 9.4 mg/dL (ref 8.7–10.2)
Chloride: 105 mmol/L (ref 96–106)
Creatinine, Ser: 0.59 mg/dL (ref 0.57–1.00)
Globulin, Total: 2.6 g/dL (ref 1.5–4.5)
Glucose: 99 mg/dL (ref 65–99)
Potassium: 4.4 mmol/L (ref 3.5–5.2)
Sodium: 140 mmol/L (ref 134–144)
Total Protein: 7.1 g/dL (ref 6.0–8.5)
eGFR: 117 mL/min/{1.73_m2} (ref >59)

## 2021-01-29 LAB — LIPID PANEL W/O CHOL/HDL RATIO
Cholesterol, Total: 203 mg/dL — ABNORMAL HIGH (ref 100–199)
HDL: 45 mg/dL (ref >39)
LDL Chol Calc (NIH): 137 mg/dL — ABNORMAL HIGH (ref 0–99)
Triglycerides: 115 mg/dL (ref 0–149)
VLDL Cholesterol Cal: 21 mg/dL (ref 5–40)

## 2021-01-29 LAB — CBC WITH DIFFERENTIAL/PLATELET
Basophils Absolute: 0.1 10*3/uL (ref 0.0–0.2)
Basos: 1 %
EOS (ABSOLUTE): 0.2 10*3/uL (ref 0.0–0.4)
Eos: 3 %
Hematocrit: 37.6 % (ref 34.0–46.6)
Hemoglobin: 12.9 g/dL (ref 11.1–15.9)
Immature Grans (Abs): 0 10*3/uL (ref 0.0–0.1)
Immature Granulocytes: 0 %
Lymphocytes Absolute: 1.9 10*3/uL (ref 0.7–3.1)
Lymphs: 26 %
MCH: 29.5 pg (ref 26.6–33.0)
MCHC: 34.3 g/dL (ref 31.5–35.7)
MCV: 86 fL (ref 79–97)
Monocytes Absolute: 0.3 10*3/uL (ref 0.1–0.9)
Monocytes: 5 %
Neutrophils Absolute: 4.9 10*3/uL (ref 1.4–7.0)
Neutrophils: 65 %
Platelets: 269 10*3/uL (ref 150–450)
RBC: 4.38 x10E6/uL (ref 3.77–5.28)
RDW: 12.5 % (ref 11.7–15.4)
WBC: 7.4 10*3/uL (ref 3.4–10.8)

## 2021-01-29 LAB — HGB A1C W/O EAG: Hgb A1c MFr Bld: 5.7 % — ABNORMAL HIGH (ref 4.8–5.6)

## 2021-03-22 ENCOUNTER — Ambulatory Visit (INDEPENDENT_AMBULATORY_CARE_PROVIDER_SITE_OTHER): Payer: Self-pay | Admitting: Internal Medicine

## 2021-03-22 ENCOUNTER — Other Ambulatory Visit: Payer: Self-pay

## 2021-03-22 ENCOUNTER — Encounter: Payer: Self-pay | Admitting: Internal Medicine

## 2021-03-22 VITALS — BP 134/94 | HR 88 | Resp 20 | Ht 62.0 in | Wt 214.0 lb

## 2021-03-22 DIAGNOSIS — J0140 Acute pansinusitis, unspecified: Secondary | ICD-10-CM

## 2021-03-22 MED ORDER — AZITHROMYCIN 250 MG PO TABS: ORAL_TABLET | ORAL | 0 refills | Status: AC

## 2021-03-22 NOTE — Patient Instructions (Addendum)
Neti pot!!!!! Drink lots of fluids

## 2021-03-22 NOTE — Progress Notes (Signed)
    Subjective:    Patient ID: Kathy Curry, female   DOB: [birth year], 40 y.o.   MRN: [id number]   HPI  Five days ago, started with stuffy nose and headache.  Took Allegra 180 mg total.  Continued to worsen with coughing 2 days later with burning chest pain during cough.  Coughing up light yellow mucous.  + posterior pharyngeal drainage and sinus pressure/pain.  Has taken ibuprofen for headache and better for a couple of hours, then returns.   Has tried tea and lime juice and some Mucinex, which caused heartburn.  Also, has tried combo cold remedy containing chlorpheniramine, tylenol and phenylephrine, that has helped.  Feeling better since the weekend with this.   No dyspnea when not coughing.  Has a bit of chest burning even when not coughing.   No nausea, vomiting or diarrhea, though bad taste in mouth.   Drinking lots of fluids.    Current Meds  Medication Sig   hydroxychloroquine (PLAQUENIL) 200 MG tablet Take 200 mg by mouth 2 (two) times daily.   metFORMIN (GLUCOPHAGE) 500 MG tablet Take 1 tablet (500 mg total) by mouth 2 (two) times daily with a meal.   Allergies  Allergen Reactions   Pineapple    Meat Extract Rash    Also, mouth ulcers.  Has tested negative with allergist/rheumatologist, however.  Generally eats vegetarian diet due to this.     Review of Systems    Objective:   BP (!) 134/94 (BP Location: Right Arm, Patient Position: Sitting, Cuff Size: Normal)   Pulse 88   Resp 20   Ht 5\' 2"  (1.575 m)   Wt 214 lb (97.1 kg)   LMP 12/09/2015 (Exact Date)   BMI 39.14 kg/m   Physical Exam NAD HEENT:  Tender over maxillary and frontal sinus areas.  PERRL, EOMI, TMs pearly gray, throat with cobbling.  No exudate Neck:  supple, No adenopathy Chest:  CTA CV:  RRR without murmur or rub. Abd:  S, + BS, NT   Assessment & Plan   Acute sinusitis:  Azithromycin 500 mg today and 250 mg daily for 4 more day.  Push fluids and neti pot.  Also continue cold remedy she has  been using.

## 2021-03-30 ENCOUNTER — Other Ambulatory Visit: Payer: Self-pay | Admitting: Internal Medicine

## 2021-03-30 MED ORDER — BENZONATATE 100 MG PO CAPS: 100.0000 mg | ORAL_CAPSULE | Freq: Two times a day (BID) | ORAL | 0 refills | Status: DC | PRN

## 2021-04-18 ENCOUNTER — Ambulatory Visit: Payer: Self-pay

## 2021-04-18 ENCOUNTER — Other Ambulatory Visit: Payer: Self-pay

## 2021-04-18 VITALS — BP 136/84

## 2021-04-18 DIAGNOSIS — Z0130 Encounter for examination of blood pressure without abnormal findings: Secondary | ICD-10-CM

## 2021-04-18 NOTE — Progress Notes (Signed)
After reporting to Dr. Delrae Alfred, no changes will be made to medication.

## 2021-08-05 ENCOUNTER — Ambulatory Visit: Payer: Self-pay | Admitting: Internal Medicine

## 2022-02-01 ENCOUNTER — Encounter: Payer: Self-pay | Admitting: Internal Medicine

## 2022-02-15 ENCOUNTER — Other Ambulatory Visit: Payer: Self-pay

## 2022-02-15 MED ORDER — METFORMIN HCL 500 MG PO TABS: 500.0000 mg | ORAL_TABLET | Freq: Two times a day (BID) | ORAL | 0 refills | Status: DC

## 2022-03-27 ENCOUNTER — Other Ambulatory Visit: Payer: Self-pay

## 2022-03-27 DIAGNOSIS — R300 Dysuria: Secondary | ICD-10-CM

## 2022-03-27 LAB — POCT URINALYSIS DIPSTICK
Bilirubin, UA: NEGATIVE
Blood, UA: NEGATIVE
Glucose, UA: NEGATIVE
Protein, UA: NEGATIVE
Spec Grav, UA: 1.01 (ref 1.010–1.025)
Urobilinogen, UA: 0.2 E.U./dL
pH, UA: 5 (ref 5.0–8.0)

## 2022-03-29 LAB — URINE CULTURE

## 2022-04-25 ENCOUNTER — Encounter: Payer: Self-pay | Admitting: Internal Medicine

## 2022-05-15 ENCOUNTER — Other Ambulatory Visit: Payer: Self-pay | Admitting: Internal Medicine

## 2022-06-13 ENCOUNTER — Other Ambulatory Visit (INDEPENDENT_AMBULATORY_CARE_PROVIDER_SITE_OTHER): Payer: Self-pay

## 2022-06-13 DIAGNOSIS — J029 Acute pharyngitis, unspecified: Secondary | ICD-10-CM

## 2022-06-13 DIAGNOSIS — J101 Influenza due to other identified influenza virus with other respiratory manifestations: Secondary | ICD-10-CM

## 2022-06-13 LAB — POCT INFLUENZA A/B
Influenza A, POC: NEGATIVE
Influenza B, POC: POSITIVE — AB

## 2022-06-13 LAB — POC COVID19 BINAXNOW: SARS Coronavirus 2 Ag: NEGATIVE

## 2022-06-13 MED ORDER — OSELTAMIVIR PHOSPHATE 75 MG PO CAPS: 75.0000 mg | ORAL_CAPSULE | Freq: Two times a day (BID) | ORAL | 0 refills | Status: DC

## 2022-06-13 NOTE — Addendum Note (Signed)
Addended by: Mariah Milling on: 06/13/2022 03:10 PM   Modules accepted: Level of Service

## 2022-06-13 NOTE — Addendum Note (Signed)
Addended by: Mariah Milling on: 06/13/2022 03:21 PM   Modules accepted: Orders

## 2022-06-13 NOTE — Progress Notes (Signed)
Patient came in for covid and flu test after experiencing headache, sore throat and congestion since 06/12/22.  Patient was positive for influenza and would like recommendations.  Tamiflu 75mg  twice daily for 5 days was sent to the pharmacy. Patient has been notified of Rx.

## 2022-06-30 ENCOUNTER — Ambulatory Visit: Payer: Self-pay | Admitting: Internal Medicine

## 2022-08-18 ENCOUNTER — Encounter: Payer: Self-pay | Admitting: Internal Medicine

## 2022-08-21 ENCOUNTER — Other Ambulatory Visit: Payer: Self-pay | Admitting: Internal Medicine

## 2022-09-18 ENCOUNTER — Encounter: Payer: Self-pay | Admitting: Internal Medicine

## 2022-09-20 ENCOUNTER — Ambulatory Visit: Payer: Self-pay | Admitting: Internal Medicine

## 2022-09-20 ENCOUNTER — Encounter: Payer: Self-pay | Admitting: Internal Medicine

## 2022-09-20 VITALS — BP 128/96 | HR 70 | Resp 16 | Ht 62.0 in | Wt 212.0 lb

## 2022-09-20 DIAGNOSIS — N838 Other noninflammatory disorders of ovary, fallopian tube and broad ligament: Secondary | ICD-10-CM

## 2022-09-20 DIAGNOSIS — E7800 Pure hypercholesterolemia, unspecified: Secondary | ICD-10-CM

## 2022-09-20 DIAGNOSIS — Z0000 Encounter for general adult medical examination without abnormal findings: Secondary | ICD-10-CM

## 2022-09-20 DIAGNOSIS — Z1231 Encounter for screening mammogram for malignant neoplasm of breast: Secondary | ICD-10-CM

## 2022-09-20 DIAGNOSIS — Z79899 Other long term (current) drug therapy: Secondary | ICD-10-CM

## 2022-09-20 DIAGNOSIS — Z23 Encounter for immunization: Secondary | ICD-10-CM

## 2022-09-20 DIAGNOSIS — R7303 Prediabetes: Secondary | ICD-10-CM

## 2022-09-20 MED ORDER — METFORMIN HCL 500 MG PO TABS: 500.0000 mg | ORAL_TABLET | Freq: Two times a day (BID) | ORAL | 3 refills | Status: DC

## 2022-09-20 NOTE — Progress Notes (Signed)
Subjective:    Patient ID: Kathy Curry, female   DOB: 03-16-1981, 42 y.o.   MRN: [id number]   HPI  CPE with pap  1.  Pap:  Had vaginal hysterectomy for benign reasons.    2.  Mammogram:  History of normal diagnostic for lateral breast pain in 2021.  No family history of breast cancer.    3.  Osteoprevention:  Drinks and eats dairy 4-5 times daily.  Physically active daily.  Not outside without coverage due to rash.  Sister recently diagnosed with osteopenia or porosis at age 22 yo.    4.  Guaiac Cards/FIT:  Never.    5.  Colonoscopy:  Never.  No family history of colon cancer.    6.  Immunizations:   Immunization History  Administered Date(s) Administered   Influenza,inj,Quad PF,6+ Mos 03/23/2016, 03/22/2017   Influenza-Unspecified 03/27/2016, 03/14/2018, 02/27/2022   Moderna Sars-Covid-2 Vaccination 07/23/2019, 08/20/2019, 04/19/2020   Td 03/19/2020   Tdap 06/12/2006, 05/10/2020        7.  Glucose/Cholesterol:  No labs since 2022   Current Meds  Medication Sig   fexofenadine (ALLEGRA) 180 MG tablet Take 180 mg by mouth daily.   hydroxychloroquine (PLAQUENIL) 200 MG tablet Take 200 mg by mouth 2 (two) times daily.   metFORMIN (GLUCOPHAGE) 500 MG tablet TAKE 1 TABLET BY MOUTH TWICE DAILY WITH A MEAL   Allergies  Allergen Reactions   Pineapple    Meat Extract Rash    Also, mouth ulcers.  Has tested negative with allergist/rheumatologist, however.  Generally eats vegetarian diet due to this.   Past Medical History:  Diagnosis Date   Allergy    tested negative save for dust mites, but has rashes possibly to foods.   Anemia 2016   iron deficiency   Prediabetes 11/09/2015   Rheumatoid arthritis (HCC) 11/05/2012   Sicca syndrome (HCC) 09/16/2015   Had tear ducts temporarily plugged at [facility]  05/2016.     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  Has partial left ovary left.  Right removed completely.   Family History  Problem Relation Age of Onset   Arthritis Mother 32       Rheumatoid athritis   Melanoma Mother        multifocal   Alcohol abuse Father    Osteoporosis Sister    Cervical cancer Sister        Cancer of cervix with HPV   Arthritis Sister        OA   Diabetes Brother    Other Brother        Customer service manager Syndrome:  variant of Guillain Barre syndrome   Allergies Daughter    Allergies Son    Allergies Son    Heart disease Maternal Grandmother 37       Cause of deat   Arthritis Maternal Grandmother        Rheumatoid   Arthritis Maternal Grandfather        Rheumatoid   Diabetes Maternal Grandfather    Parkinson's disease Maternal Grandfather    Social History   Socioeconomic History   Marital status: Married    Spouse name: Gabo   Number of children: 3   Years of education:  12+   Highest education level: Associate degree: occupational, Scientist, product/process development, or vocational program  Occupational History   Occupation: Water engineer [employer]  Tobacco Use   Smoking status: Never    Passive exposure: Never   Smokeless tobacco: Never  Vaping Use   Vaping status: Never Used  Substance and Sexual Activity   Alcohol use: Yes    Alcohol/week: 0.0 standard drinks of alcohol    Comment: occasional   Drug use: No   Sexual activity: Yes  Other Topics Concern   Not on file  Social History Narrative    Originally from [place]   Came to [place]. In 2002   Lives at home with husband and 3 children.   Was head of our [employer] and son, Jacqlyn Larsen our Med assist here.   Social Determinants of Health   Financial Resource Strain: Low Risk  (09/20/2022)   Overall Financial Resource Strain (CARDIA)    Difficulty of Paying Living Expenses: Not hard at all  Food Insecurity: No Food  Insecurity (09/20/2022)   Hunger Vital Sign    Worried About Running Out of Food in the Last Year: Never true    Ran Out of Food in the Last Year: Never true  Transportation Needs: No Transportation Needs (09/20/2022)   PRAPARE - Administrator, Civil Service (Medical): No    Lack of Transportation (Non-Medical): No  Physical Activity: Not on file  Stress: Not on file  Social Connections: Not on file  Intimate Partner Violence: Not At Risk (09/20/2022)   Humiliation, Afraid, Rape, and Kick questionnaire    Fear of Current or Ex-Partner: No    Emotionally Abused: No    Physically Abused: No    Sexually Abused: No     Review of Systems  Respiratory:  Negative for shortness of breath.   Cardiovascular:  Negative for chest pain, palpitations and leg swelling.  Gastrointestinal:  Negative for abdominal pain and blood in stool (no melena).  Psychiatric/Behavioral:  Negative for dysphoric mood. The patient is nervous/anxious (Has ways to decrease--travel to mountains.).       Objective:   BP (!) 128/96   Pulse 70   Resp 16   Ht 5\' 2"  (1.575 m)   Wt 212 lb (96.2 kg)   LMP 12/09/2015 (Exact Date)   BMI 38.78 kg/m   Physical Exam Constitutional:      Appearance: She is obese.  HENT:     Head: Normocephalic and atraumatic.     Right Ear: Tympanic membrane, ear canal and external ear normal.     Left Ear: Tympanic membrane, ear canal and external ear normal.     Nose: Nose normal.     Mouth/Throat:     Mouth: Mucous membranes are moist.     Pharynx: Oropharynx is clear.  Eyes:     Extraocular Movements: Extraocular movements intact.     Conjunctiva/sclera: Conjunctivae normal.     Pupils: Pupils are equal, round, and reactive to light.     Comments: Discs sharp  Neck:     Thyroid: No thyroid mass or thyromegaly.  Cardiovascular:     Rate and Rhythm: Normal rate and regular rhythm.     Heart sounds: S1 normal and S2 normal. No murmur heard.    No friction rub.  No S3 or S4 sounds.     Comments: No carotid bruits.  Carotid, radial, femoral, DP and PT pulses normal and equal.   Pulmonary:     Effort: Pulmonary  effort is normal.     Breath sounds: Normal breath sounds and air entry.  Chest:  Breasts:    Right: No inverted nipple, mass or nipple discharge.     Left: No inverted nipple, mass or nipple discharge.  Abdominal:     General: Bowel sounds are normal.     Palpations: Abdomen is soft. There is no hepatomegaly, splenomegaly or mass.     Tenderness: There is no abdominal tenderness.     Hernia: No hernia is present.  Genitourinary:    Comments: Normal external female genitalia No palpable uterus.   No adnexal mass or tenderness Left adnexa is enlarged, but smooth and round Musculoskeletal:        General: Normal range of motion.     Cervical back: Normal range of motion and neck supple.     Right lower leg: No edema.     Left lower leg: No edema.  Lymphadenopathy:     Head:     Right side of head: No submental or submandibular adenopathy.     Left side of head: No submental or submandibular adenopathy.     Cervical: No cervical adenopathy.     Upper Body:     Right upper body: No supraclavicular or axillary adenopathy.     Left upper body: No supraclavicular or axillary adenopathy.     Lower Body: No right inguinal adenopathy. No left inguinal adenopathy.  Skin:    General: Skin is warm.     Capillary Refill: Capillary refill takes less than 2 seconds.     Findings: No rash.  Neurological:     General: No focal deficit present.     Mental Status: She is alert and oriented to person, place, and time.     Cranial Nerves: Cranial nerves 2-12 are intact.     Sensory: Sensation is intact.     Motor: Motor function is intact.     Coordination: Coordination is intact.     Gait: Gait is intact.     Deep Tendon Reflexes: Reflexes are normal and symmetric.  Psychiatric:        Mood and Affect: Mood normal.        Speech: Speech  normal.        Behavior: Behavior normal. Behavior is cooperative.      Assessment & Plan     CPE without pap Mammogram Moderna Spikevax CMP, CBC., FLP Return FIT in 2 weeks.  2.  Prediabetes/obesity:  A1C.  Continue to work on lifestyle.    3.  Enlarged left adnexa:  Pelvic US.    4.  Hypercholesterolemia:  FLP  5.  Elevated BP:  recheck in 1 week

## 2022-09-21 LAB — CBC WITH DIFFERENTIAL/PLATELET
Basophils Absolute: 0 10*3/uL (ref 0.0–0.2)
Basos: 1 %
EOS (ABSOLUTE): 0.2 10*3/uL (ref 0.0–0.4)
Eos: 3 %
Hematocrit: 40.2 % (ref 34.0–46.6)
Hemoglobin: 13 g/dL (ref 11.1–15.9)
Immature Grans (Abs): 0 10*3/uL (ref 0.0–0.1)
Immature Granulocytes: 0 %
Lymphocytes Absolute: 1.9 10*3/uL (ref 0.7–3.1)
Lymphs: 32 %
MCH: 28.8 pg (ref 26.6–33.0)
MCHC: 32.3 g/dL (ref 31.5–35.7)
MCV: 89 fL (ref 79–97)
Monocytes Absolute: 0.4 10*3/uL (ref 0.1–0.9)
Monocytes: 6 %
Neutrophils Absolute: 3.6 10*3/uL (ref 1.4–7.0)
Neutrophils: 58 %
Platelets: 250 10*3/uL (ref 150–450)
RBC: 4.52 x10E6/uL (ref 3.77–5.28)
RDW: 12.3 % (ref 11.7–15.4)
WBC: 6 10*3/uL (ref 3.4–10.8)

## 2022-09-21 LAB — LIPID PANEL W/O CHOL/HDL RATIO
Cholesterol, Total: 174 mg/dL (ref 100–199)
HDL: 44 mg/dL (ref >39)
LDL Chol Calc (NIH): 102 mg/dL — ABNORMAL HIGH (ref 0–99)
Triglycerides: 162 mg/dL — ABNORMAL HIGH (ref 0–149)
VLDL Cholesterol Cal: 28 mg/dL (ref 5–40)

## 2022-09-21 LAB — COMPREHENSIVE METABOLIC PANEL
ALT: 40 IU/L — ABNORMAL HIGH (ref 0–32)
AST: 28 IU/L (ref 0–40)
Albumin/Globulin Ratio: 1.4 (ref 1.2–2.2)
Albumin: 4.2 g/dL (ref 3.9–4.9)
Alkaline Phosphatase: 61 IU/L (ref 44–121)
BUN/Creatinine Ratio: 10 (ref 9–23)
BUN: 6 mg/dL (ref 6–24)
Bilirubin Total: 0.3 mg/dL (ref 0.0–1.2)
CO2: 20 mmol/L (ref 20–29)
Calcium: 9.3 mg/dL (ref 8.7–10.2)
Chloride: 105 mmol/L (ref 96–106)
Creatinine, Ser: 0.63 mg/dL (ref 0.57–1.00)
Globulin, Total: 2.9 g/dL (ref 1.5–4.5)
Glucose: 85 mg/dL (ref 70–99)
Potassium: 4.1 mmol/L (ref 3.5–5.2)
Sodium: 141 mmol/L (ref 134–144)
Total Protein: 7.1 g/dL (ref 6.0–8.5)
eGFR: 115 mL/min/{1.73_m2} (ref >59)

## 2022-09-21 LAB — HGB A1C W/O EAG: Hgb A1c MFr Bld: 5.9 % — ABNORMAL HIGH (ref 4.8–5.6)

## 2022-09-27 ENCOUNTER — Other Ambulatory Visit: Payer: Self-pay

## 2022-10-10 ENCOUNTER — Ambulatory Visit
Admission: RE | Admit: 2022-10-10 | Discharge: 2022-10-10 | Disposition: A | Discharge status: Home / Self Care | Payer: No Typology Code available for payment source | Source: Ambulatory Visit | Attending: Internal Medicine | Admitting: Internal Medicine

## 2022-10-10 DIAGNOSIS — N838 Other noninflammatory disorders of ovary, fallopian tube and broad ligament: Secondary | ICD-10-CM

## 2022-10-11 ENCOUNTER — Other Ambulatory Visit: Payer: Self-pay

## 2022-10-11 VITALS — BP 124/86 | HR 68

## 2022-10-11 DIAGNOSIS — Z0130 Encounter for examination of blood pressure without abnormal findings: Secondary | ICD-10-CM

## 2022-10-11 NOTE — Progress Notes (Signed)
After reporting bp to Dr Mulberry, no changes to medication will be made  

## 2022-10-16 ENCOUNTER — Other Ambulatory Visit: Payer: Self-pay

## 2022-10-16 DIAGNOSIS — N83209 Unspecified ovarian cyst, unspecified side: Secondary | ICD-10-CM

## 2022-10-17 LAB — CA 125: Cancer Antigen (CA) 125: 13.3 U/mL (ref 0.0–38.1)

## 2022-10-17 NOTE — Addendum Note (Signed)
Addended by: Marcene Duos on: 10/17/2022 08:50 AM   Modules accepted: Orders

## 2022-10-24 ENCOUNTER — Telehealth: Payer: Self-pay

## 2022-10-24 NOTE — Telephone Encounter (Signed)
Attempted to call patient using interpreter#[phone]. Left voice mail with [facility] (scholarship) contact information.

## 2022-12-12 ENCOUNTER — Other Ambulatory Visit: Payer: Self-pay

## 2022-12-12 DIAGNOSIS — R7401 Elevation of levels of liver transaminase levels: Secondary | ICD-10-CM

## 2022-12-13 LAB — HEPATIC FUNCTION PANEL
ALT: 18 IU/L (ref 0–32)
AST: 14 IU/L (ref 0–40)
Albumin: 4.3 g/dL (ref 3.9–4.9)
Alkaline Phosphatase: 64 IU/L (ref 44–121)
Bilirubin Total: 0.3 mg/dL (ref 0.0–1.2)
Bilirubin, Direct: <0.1 mg/dL (ref 0.00–0.40)
Total Protein: 6.6 g/dL (ref 6.0–8.5)

## 2022-12-13 LAB — HEPATITIS B SURFACE ANTIBODY,QUALITATIVE: Hep B Surface Ab, Qual: NONREACTIVE

## 2022-12-28 ENCOUNTER — Other Ambulatory Visit (INDEPENDENT_AMBULATORY_CARE_PROVIDER_SITE_OTHER): Payer: Self-pay

## 2022-12-28 DIAGNOSIS — R748 Abnormal levels of other serum enzymes: Secondary | ICD-10-CM

## 2022-12-28 DIAGNOSIS — Z23 Encounter for immunization: Secondary | ICD-10-CM

## 2022-12-29 LAB — HEPATITIS B SURFACE ANTIGEN: Hepatitis B Surface Ag: NEGATIVE

## 2022-12-29 LAB — HEPATITIS B CORE ANTIBODY, TOTAL: Hep B Core Total Ab: NEGATIVE

## 2022-12-29 LAB — HEPATITIS A ANTIBODY, TOTAL: hep A Total Ab: POSITIVE — AB

## 2023-02-16 ENCOUNTER — Other Ambulatory Visit: Payer: No Typology Code available for payment source

## 2023-03-06 ENCOUNTER — Ambulatory Visit: Payer: Self-pay | Admitting: Internal Medicine

## 2023-03-08 ENCOUNTER — Ambulatory Visit: Payer: Self-pay | Admitting: Internal Medicine

## 2023-03-09 ENCOUNTER — Other Ambulatory Visit: Payer: No Typology Code available for payment source

## 2023-03-12 ENCOUNTER — Ambulatory Visit: Payer: Self-pay | Admitting: Internal Medicine

## 2023-03-12 ENCOUNTER — Other Ambulatory Visit: Payer: No Typology Code available for payment source

## 2023-03-13 ENCOUNTER — Other Ambulatory Visit: Payer: No Typology Code available for payment source

## 2023-03-14 ENCOUNTER — Ambulatory Visit (INDEPENDENT_AMBULATORY_CARE_PROVIDER_SITE_OTHER): Payer: Self-pay | Admitting: Internal Medicine

## 2023-03-14 DIAGNOSIS — Z23 Encounter for immunization: Secondary | ICD-10-CM

## 2023-03-16 ENCOUNTER — Ambulatory Visit
Admission: RE | Admit: 2023-03-16 | Discharge: 2023-03-16 | Disposition: A | Discharge status: Home / Self Care | Payer: No Typology Code available for payment source | Source: Ambulatory Visit | Attending: Internal Medicine | Admitting: Internal Medicine

## 2023-03-16 DIAGNOSIS — N83209 Unspecified ovarian cyst, unspecified side: Secondary | ICD-10-CM

## 2023-03-28 ENCOUNTER — Other Ambulatory Visit: Payer: Self-pay

## 2023-03-28 VITALS — BP 126/82 | HR 60

## 2023-03-28 DIAGNOSIS — Z0130 Encounter for examination of blood pressure without abnormal findings: Secondary | ICD-10-CM

## 2023-03-30 LAB — HM DIABETES EYE EXAM

## 2023-04-02 ENCOUNTER — Other Ambulatory Visit: Payer: Self-pay | Admitting: Internal Medicine

## 2023-04-02 DIAGNOSIS — N83201 Unspecified ovarian cyst, right side: Secondary | ICD-10-CM

## 2023-05-28 ENCOUNTER — Other Ambulatory Visit: Payer: Self-pay

## 2023-05-28 DIAGNOSIS — N83201 Unspecified ovarian cyst, right side: Secondary | ICD-10-CM

## 2023-05-29 LAB — CBC WITH DIFFERENTIAL/PLATELET
Basophils Absolute: 0.1 10*3/uL (ref 0.0–0.2)
Basos: 1 %
EOS (ABSOLUTE): 0.2 10*3/uL (ref 0.0–0.4)
Eos: 3 %
Hematocrit: 38.4 % (ref 34.0–46.6)
Hemoglobin: 13.1 g/dL (ref 11.1–15.9)
Immature Grans (Abs): 0 10*3/uL (ref 0.0–0.1)
Immature Granulocytes: 0 %
Lymphocytes Absolute: 2.7 10*3/uL (ref 0.7–3.1)
Lymphs: 39 %
MCH: 30.5 pg (ref 26.6–33.0)
MCHC: 34.1 g/dL (ref 31.5–35.7)
MCV: 90 fL (ref 79–97)
Monocytes Absolute: 0.5 10*3/uL (ref 0.1–0.9)
Monocytes: 7 %
Neutrophils Absolute: 3.5 10*3/uL (ref 1.4–7.0)
Neutrophils: 50 %
Platelets: 269 10*3/uL (ref 150–450)
RBC: 4.29 x10E6/uL (ref 3.77–5.28)
RDW: 12.2 % (ref 11.7–15.4)
WBC: 6.9 10*3/uL (ref 3.4–10.8)

## 2023-05-29 LAB — CANCER ANTIGEN 19-9: CA 19-9: 7 U/mL (ref 0–35)

## 2023-05-29 LAB — CEA: CEA: 0.7 ng/mL (ref 0.0–4.7)

## 2023-05-29 LAB — CA 125: Cancer Antigen (CA) 125: 12 U/mL (ref 0.0–38.1)

## 2023-05-30 HISTORY — PX: LAPAROSCOPIC BILATERAL SALPINGECTOMY: SHX5889

## 2023-07-05 ENCOUNTER — Ambulatory Visit: Payer: Self-pay | Admitting: Internal Medicine

## 2023-09-19 ENCOUNTER — Other Ambulatory Visit: Payer: Self-pay

## 2023-09-19 VITALS — Temp 98.3°F

## 2023-09-19 DIAGNOSIS — R509 Fever, unspecified: Secondary | ICD-10-CM

## 2023-09-19 DIAGNOSIS — J101 Influenza due to other identified influenza virus with other respiratory manifestations: Secondary | ICD-10-CM

## 2023-09-19 LAB — POCT INFLUENZA A/B
Influenza A, POC: POSITIVE — AB
Influenza B, POC: NEGATIVE

## 2023-09-19 LAB — POC COVID19 BINAXNOW: SARS Coronavirus 2 Ag: NEGATIVE

## 2023-09-19 MED ORDER — OSELTAMIVIR PHOSPHATE 75 MG PO CAPS: ORAL_CAPSULE | ORAL | 0 refills | Status: DC

## 2023-09-19 MED ORDER — OSELTAMIVIR PHOSPHATE 75 MG PO CAPS: 75.0000 mg | ORAL_CAPSULE | Freq: Two times a day (BID) | ORAL | 0 refills | Status: DC

## 2023-09-19 NOTE — Progress Notes (Signed)
 Notified of Influenza A positivity.  Discussed she is a bit late with starting meds, but fairly symptomatic with coughing. SingleCare coupon discussed and sent to [facility] on [address]

## 2023-09-19 NOTE — Progress Notes (Signed)
 Patient reports fever and cough since Sunday.  No tempeture available.  Sudafed taking as treatment   Today temp is 98.3

## 2023-09-26 ENCOUNTER — Ambulatory Visit: Payer: Self-pay | Admitting: Internal Medicine

## 2023-09-26 ENCOUNTER — Encounter: Payer: Self-pay | Admitting: Internal Medicine

## 2023-09-26 VITALS — BP 110/80 | HR 67 | Resp 16 | Ht 63.0 in | Wt 212.5 lb

## 2023-09-26 DIAGNOSIS — Z124 Encounter for screening for malignant neoplasm of cervix: Secondary | ICD-10-CM

## 2023-09-26 DIAGNOSIS — Z1231 Encounter for screening mammogram for malignant neoplasm of breast: Secondary | ICD-10-CM

## 2023-09-26 DIAGNOSIS — Z23 Encounter for immunization: Secondary | ICD-10-CM

## 2023-09-26 DIAGNOSIS — Z0000 Encounter for general adult medical examination without abnormal findings: Secondary | ICD-10-CM

## 2023-09-26 DIAGNOSIS — R7303 Prediabetes: Secondary | ICD-10-CM

## 2023-09-26 DIAGNOSIS — E7800 Pure hypercholesterolemia, unspecified: Secondary | ICD-10-CM

## 2023-09-26 MED ORDER — MOMETASONE FUROATE 50 MCG/ACT NA SUSP: NASAL | Status: AC

## 2023-09-26 NOTE — Patient Instructions (Signed)
 Kathy Curry Med or other blunt nosed Neti pot with distilled water at body temp daily before Nasonex.

## 2023-09-26 NOTE — Progress Notes (Signed)
 Subjective:    Patient ID: Kathy Curry, female   DOB: 1980/11/18, 43 y.o.   MRN: [id number]   HPI  CPE without pap  1.  Pap:  Last pap in 2017 or so prior to vaginal hysterectomy for benign reasons.  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  Ovaries left.  Paps always normal in past.    2.  Mammogram:  Last in 2021 and normal.  No family history of breast cancer.    3.  Osteoprevention:  Almond mild 3-4 servings daily.  Exercises 5-6 days weekly.  Does get outside, but limits due to allergies.    4.  Guaiac Cards/FIT:  Never.    5.  Colonoscopy:  Never.  No family history of colon cancer.    6.  Immunizations:  Needs COVID and 3rd Hep B  Immunization History  Administered Date(s) Administered   Hepatitis B, ADULT 12/28/2022, 03/14/2023   Influenza Inj Mdck Quad Pf 05/23/2021, 03/02/2022   Influenza,inj,Quad PF,6+ Mos 03/23/2016, 03/22/2017   Influenza-Unspecified 03/27/2016, 03/14/2018, 02/27/2022, 03/16/2023   MMR 01/26/2023   Moderna Covid-19 Fall Seasonal Vaccine 46yrs & older 09/20/2022   Moderna Covid-19 Vaccine Bivalent Booster 38yrs & up 03/25/2021   Moderna Sars-Covid-2 Vaccination 07/23/2019, 08/20/2019, 04/19/2020   Td 03/19/2020   Tdap 06/12/2006, 05/10/2020   Varicella 01/26/2023     7.  Glucose/Cholesterol:  prediabetes with last 5.9% 1 year ago.  Last cholesterol improved a year ago with slightly elevated trigs. Lipid Panel     Component Value Date/Time   CHOL 174 09/20/2022 1134   TRIG 162 (H) 09/20/2022 1134   HDL 44 09/20/2022 1134   CHOLHDL 5.2 (H) 03/26/2015 0854   LDLCALC 102 (H) 09/20/2022 1134   LABVLDL 28 09/20/2022 1134     Current Meds  Medication Sig   fexofenadine (ALLEGRA) 180 MG tablet Take 180 mg by mouth daily.   hydroxychloroquine (PLAQUENIL) 200 MG tablet Take 200 mg by mouth 2 (two) times daily.   metFORMIN (GLUCOPHAGE) 500 MG tablet Take 1 tablet (500 mg total) by mouth 2  (two) times daily with a meal.   Allergies  Allergen Reactions   Pineapple    Meat Extract Rash    Also, mouth ulcers.  Has tested negative with allergist/rheumatologist, however.  Generally eats vegetarian diet due to this.   Past Medical History:  Diagnosis Date   Allergy    tested negative save for dust mites, but has rashes possibly to foods.   Anemia 2016   iron deficiency   Prediabetes 11/09/2015   Rheumatoid arthritis (HCC) 11/05/2012   Sicca syndrome (HCC) 09/16/2015   Had tear ducts temporarily plugged at [facility]  05/2016.     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  Has partial left ovary left.  Right removed completely.    Social History   Socioeconomic History   Marital status: Married    Spouse name: Gabo   Number of children: 3   Years of education: 12+   Highest education level: Tax adviser degree: occupational, Scientist, product/process development, or vocational program  Occupational History   Occupation: Water engineer [employer]  Tobacco Use   Smoking status: Never    Passive exposure: Never   Smokeless tobacco: Never  Vaping Use   Vaping status: Never Used  Substance and Sexual Activity   Alcohol use: Yes    Alcohol/week: 0.0 standard drinks of alcohol    Comment: occasional   Drug use: No   Sexual activity: Yes    Birth control/protection: None  Other Topics Concern   Not on file  Social History Narrative   Lives at home with husband and 3 children.   Was head of our [employer] and son, Latricia Poles our Med assist here.   Social Drivers of Corporate investment banker Strain: Low Risk  (09/26/2023)   Overall Financial Resource Strain (CARDIA)     Difficulty of Paying Living Expenses: Not hard at all  Food Insecurity: No Food Insecurity (09/26/2023)   Hunger Vital Sign    Worried About Running Out of Food in the Last Year: Never true    Ran Out of Food in the Last Year: Never true  Transportation Needs: No Transportation Needs (09/26/2023)   PRAPARE - Administrator, Civil Service (Medical): No    Lack of Transportation (Non-Medical): No  Physical Activity: Not on file  Stress: Not on file  Social Connections: Not on file  Intimate Partner Violence: Not At Risk (09/26/2023)   Humiliation, Afraid, Rape, and Kick questionnaire    Fear of Current or Ex-Partner: No    Emotionally Abused: No    Physically Abused: No    Sexually Abused: No     Review of Systems  HENT:  Negative for dental problem (Gets through [employer]).   Eyes:  Negative for visual disturbance (Stable with glasses.  Dr. Rodolfo Clan retiring.  Looking for another eye doctor.).  Respiratory:  Positive for cough. Negative for shortness of breath.        Cough with influenza and treated with Tamiflu about 1 week ago.  Improved with Tamiflu.  No longer with chest discomfort associated with cough.   She also with allergies and is having nasal stuffiness.   Cough productive of mild yellow phlegm.    Cardiovascular:  Negative for chest pain, palpitations and leg swelling.  Neurological:  Negative for weakness and numbness.  Psychiatric/Behavioral:  Negative for dysphoric mood. The patient is not nervous/anxious.       Objective:   BP 110/80 (BP Location: Right Arm, Patient Position: Sitting, Cuff Size: Normal)   Pulse 67   Resp 16   Ht 5\' 3"  (1.6 m)   Wt 212 lb 8 oz (96.4 kg)   LMP 12/09/2015 (Exact Date)   SpO2 98%   BMI 37.64 kg/m   Physical Exam HENT:     Head: Normocephalic and atraumatic.     Right Ear: Tympanic membrane, ear canal and external ear normal.     Left Ear: Tympanic membrane, ear canal and external ear normal.      Nose: Congestion and rhinorrhea present.     Mouth/Throat:     Mouth: Mucous membranes are moist.     Pharynx: Oropharynx is clear. No oropharyngeal exudate or posterior oropharyngeal erythema.  Eyes:     Extraocular Movements: Extraocular movements intact.     Conjunctiva/sclera: Conjunctivae normal.     Pupils: Pupils are equal, round, and reactive to light.     Comments: Discs sharp  Neck:     Thyroid: No thyromegaly.  Cardiovascular:     Rate and Rhythm: Normal rate and regular  rhythm.     Heart sounds: S1 normal and S2 normal. No murmur heard.    No friction rub. No S3 or S4 sounds.     Comments: No carotid bruits.  Carotid, radial, femoral, DP and PT pulses normal and equal.   Pulmonary:     Breath sounds: Normal breath sounds and air entry.  Chest:  Breasts:    Right: No inverted nipple, mass or nipple discharge.     Left: No inverted nipple, mass or nipple discharge.     Comments: Bilateral axillary breasts without areola or nipple.  No focal mass or tenderness Abdominal:     General: Bowel sounds are normal.     Palpations: Abdomen is soft. There is no hepatomegaly, splenomegaly or mass.     Tenderness: There is no abdominal tenderness.  Genitourinary:    Comments: Deferred as had laparoscopic exam of ovaries with surgery in December. Musculoskeletal:     Right lower leg: No edema.     Left lower leg: No edema.  Feet:     Right foot:     Skin integrity: Skin integrity normal.     Left foot:     Skin integrity: Skin integrity normal.  Lymphadenopathy:     Head:     Right side of head: No submental or submandibular adenopathy.     Left side of head: No submental or submandibular adenopathy.     Cervical: No cervical adenopathy.     Upper Body:     Right upper body: No supraclavicular or axillary adenopathy.     Left upper body: No supraclavicular or axillary adenopathy.     Lower Body: No right inguinal adenopathy. No left inguinal adenopathy.  Skin:     Findings: No rash.  Neurological:     Mental Status: She is alert.     Cranial Nerves: Cranial nerves 2-12 are intact.     Sensory: Sensation is intact.     Motor: Motor function is intact.     Coordination: Coordination is intact.     Deep Tendon Reflexes: Reflexes are normal and symmetric.  Psychiatric:        Speech: Speech normal.        Behavior: Behavior normal. Behavior is cooperative.      Assessment & Plan   CPE without pap Mammogram ordered Hep B #3/3 Spikevax FIT to return in 2 weeks CBC, CMP, Vitamin D level as not outside much  2.  Prediabetes:  A1C  3.  Hypercholesterolemia:  FLP  4.  Allergies:  consider addition of neti pot wash followed by Nasonex 2 sprays each nostril daily.

## 2023-09-27 ENCOUNTER — Other Ambulatory Visit: Payer: Self-pay

## 2023-09-27 LAB — CBC WITH DIFFERENTIAL/PLATELET
Basophils Absolute: 0 10*3/uL (ref 0.0–0.2)
Basos: 0 %
EOS (ABSOLUTE): 0.1 10*3/uL (ref 0.0–0.4)
Eos: 2 %
Hematocrit: 38 % (ref 34.0–46.6)
Hemoglobin: 12.7 g/dL (ref 11.1–15.9)
Immature Grans (Abs): 0 10*3/uL (ref 0.0–0.1)
Immature Granulocytes: 0 %
Lymphocytes Absolute: 2.2 10*3/uL (ref 0.7–3.1)
Lymphs: 34 %
MCH: 29.5 pg (ref 26.6–33.0)
MCHC: 33.4 g/dL (ref 31.5–35.7)
MCV: 88 fL (ref 79–97)
Monocytes Absolute: 0.3 10*3/uL (ref 0.1–0.9)
Monocytes: 5 %
Neutrophils Absolute: 3.8 10*3/uL (ref 1.4–7.0)
Neutrophils: 59 %
Platelets: 260 10*3/uL (ref 150–450)
RBC: 4.3 x10E6/uL (ref 3.77–5.28)
RDW: 12.6 % (ref 11.7–15.4)
WBC: 6.5 10*3/uL (ref 3.4–10.8)

## 2023-09-27 LAB — HGB A1C W/O EAG: Hgb A1c MFr Bld: 5.9 % — ABNORMAL HIGH (ref 4.8–5.6)

## 2023-09-27 LAB — COMPREHENSIVE METABOLIC PANEL WITH GFR
ALT: 15 IU/L (ref 0–32)
AST: 16 IU/L (ref 0–40)
Albumin: 4.2 g/dL (ref 3.9–4.9)
Alkaline Phosphatase: 73 IU/L (ref 44–121)
BUN/Creatinine Ratio: 11 (ref 9–23)
BUN: 7 mg/dL (ref 6–24)
Bilirubin Total: 0.3 mg/dL (ref 0.0–1.2)
CO2: 24 mmol/L (ref 20–29)
Calcium: 8.7 mg/dL (ref 8.7–10.2)
Chloride: 105 mmol/L (ref 96–106)
Creatinine, Ser: 0.61 mg/dL (ref 0.57–1.00)
Globulin, Total: 2.3 g/dL (ref 1.5–4.5)
Glucose: 80 mg/dL (ref 70–99)
Potassium: 4 mmol/L (ref 3.5–5.2)
Sodium: 142 mmol/L (ref 134–144)
Total Protein: 6.5 g/dL (ref 6.0–8.5)
eGFR: 115 mL/min/{1.73_m2} (ref >59)

## 2023-09-27 LAB — LIPID PANEL W/O CHOL/HDL RATIO
Cholesterol, Total: 179 mg/dL (ref 100–199)
HDL: 39 mg/dL — ABNORMAL LOW (ref >39)
LDL Chol Calc (NIH): 110 mg/dL — ABNORMAL HIGH (ref 0–99)
Triglycerides: 171 mg/dL — ABNORMAL HIGH (ref 0–149)
VLDL Cholesterol Cal: 30 mg/dL (ref 5–40)

## 2023-09-27 LAB — VITAMIN D 25 HYDROXY (VIT D DEFICIENCY, FRACTURES): Vit D, 25-Hydroxy: 17.2 ng/mL — ABNORMAL LOW (ref 30.0–100.0)

## 2023-09-27 MED ORDER — METFORMIN HCL 500 MG PO TABS
500.0000 mg | ORAL_TABLET | Freq: Two times a day (BID) | ORAL | 3 refills | Status: AC
Start: 2023-09-27 — End: ?

## 2023-10-01 ENCOUNTER — Encounter: Payer: Self-pay | Admitting: Internal Medicine

## 2023-10-05 ENCOUNTER — Telehealth: Payer: Self-pay

## 2023-10-05 NOTE — Telephone Encounter (Signed)
 Telephoned patient at mobile number using interpreter#[phone]. Left a voice message with [facility] (scholarship) contact information.

## 2023-10-09 ENCOUNTER — Other Ambulatory Visit: Payer: Self-pay | Admitting: Obstetrics and Gynecology

## 2023-10-09 DIAGNOSIS — Z1231 Encounter for screening mammogram for malignant neoplasm of breast: Secondary | ICD-10-CM

## 2023-12-25 ENCOUNTER — Telehealth: Payer: Self-pay | Admitting: Internal Medicine

## 2023-12-25 ENCOUNTER — Ambulatory Visit: Payer: Self-pay | Admitting: Internal Medicine

## 2023-12-25 ENCOUNTER — Encounter: Payer: Self-pay | Admitting: Internal Medicine

## 2023-12-25 VITALS — BP 110/74 | HR 64 | Temp 98.0°F | Resp 14 | Ht 63.0 in | Wt 217.0 lb

## 2023-12-25 DIAGNOSIS — S90862A Insect bite (nonvenomous), left foot, initial encounter: Secondary | ICD-10-CM

## 2023-12-25 MED ORDER — CEPHALEXIN 250 MG PO TABS: 250.0000 mg | ORAL_TABLET | Freq: Four times a day (QID) | ORAL | 0 refills | Status: DC

## 2023-12-25 MED ORDER — TRIAMCINOLONE ACETONIDE 0.1 % EX CREA: TOPICAL_CREAM | CUTANEOUS | 0 refills | Status: AC

## 2023-12-25 NOTE — Patient Instructions (Signed)
 Allegra  in the morning and Benadryl 25 mg at bedtime. Elevate and ice area of foot

## 2023-12-25 NOTE — Telephone Encounter (Signed)
 Patient called and states that she would like to know if she can be seen . Patient states on Sunday she felt like she was bit on her foot, patient states when she looked down she saw an ant and thought that could have been what bit her, Patient reports she has noticed today that it seems like two different stings . Patient describes one of the sting looks normal but the othe rone looks swollen and aroub

## 2023-12-25 NOTE — Progress Notes (Signed)
    Subjective:    Patient ID: Kathy Curry, female   DOB: 1981-02-06, 43 y.o.   MRN: [id number]   HPI  [place].  Wearing sandals and felt a bite or sting and looked down to see 2 ants on her foot.  She developed 2 bumps and one has gradually hardened and burning.  Also, has had watery discharge from cental opening.  Both bumps itch.   No fever.    Current Meds  Medication Sig   fexofenadine  (ALLEGRA ) 180 MG tablet Take 180 mg by mouth daily.   hydroxychloroquine (PLAQUENIL) 200 MG tablet Take 200 mg by mouth 2 (two) times daily.   metFORMIN  (GLUCOPHAGE ) 500 MG tablet Take 1 tablet (500 mg total) by mouth 2 (two) times daily with a meal.   Allergies  Allergen Reactions   Pineapple    Meat Extract Rash    Also, mouth ulcers.  Has tested negative with allergist/rheumatologist, however.  Generally eats vegetarian diet due to this.     Review of Systems    Objective:   BP 110/74 (BP Location: Right Arm, Patient Position: Sitting, Cuff Size: Normal)   Pulse 64   Temp 98 F (36.7 C) (Oral)   Resp 14   Ht 5' 3 (1.6 m)   Wt 217 lb (98.4 kg)   LMP 12/09/2015 (Exact Date)   BMI 38.44 kg/m   Physical Exam NAD On distal dorsal, left foot:  2 vesicles with milky fluid, on about half dollar diameter with mild erythema and edema and dime sized central induration about the vesicle.  The smaller is about 5 mm in diameter with induration about vesicle and no surrounding erythema and edema.   Minimal tenderness to area.    Assessment & Plan  Likely fire ant bites:  elevate foot and apply ice or cold compresses for 20 minutes every 2-3 hours.   Triamcinolone  cream 4 times daily just to bite areas Cephalexin  250 mg 4 times daily for 5 days if erythema and swelling seems to spread.   At this time, however, does not appear to be infected.   Tetanus up to date

## 2023-12-25 NOTE — Telephone Encounter (Signed)
 Patient has been schedule for today at 12:30 PM.

## 2023-12-25 NOTE — Telephone Encounter (Signed)
 Patient called and states that she would like to know if she can be seen . Patient states on Sunday she felt like she was bit on her foot, patient states when she looked down she saw an ant and thought that could have been what bit her, Patient reports she has noticed today that it seems like two different stings . Patient describes one of the sting looks normal but the other one looks swollen and around the bite looks like a red circle and seems hard. Also patient describes in the middle of the circle it seems like it has liquid.

## 2023-12-25 NOTE — Telephone Encounter (Signed)
 Work into illness visit today

## 2024-01-03 ENCOUNTER — Ambulatory Visit: Payer: Self-pay

## 2024-01-03 ENCOUNTER — Inpatient Hospital Stay: Admission: RE | Admit: 2024-01-03 | Source: Ambulatory Visit

## 2024-01-04 ENCOUNTER — Other Ambulatory Visit: Payer: Self-pay

## 2024-01-04 DIAGNOSIS — R7989 Other specified abnormal findings of blood chemistry: Secondary | ICD-10-CM

## 2024-01-05 LAB — VITAMIN D 25 HYDROXY (VIT D DEFICIENCY, FRACTURES): Vit D, 25-Hydroxy: 17.6 ng/mL — ABNORMAL LOW (ref 30.0–100.0)

## 2024-01-16 ENCOUNTER — Telehealth: Payer: Self-pay | Admitting: Internal Medicine

## 2024-01-16 NOTE — Telephone Encounter (Signed)
 Spoke with pt. And advised her that she should been seen at a urgent care per doctors orders.

## 2024-01-16 NOTE — Telephone Encounter (Signed)
 Patient states she needs an appointment to be seen for patient states she has a small nodule in her eye that started a week ago , patient states she went to the ophthalmologist to get check but was told her eyes were fine and to apply cold compresses.  Patient states she noticed over the weekend that her eye got worst and believes there is possible more nodules. Patient called ophthalmologist and was prescribed ointment called erythromycin ophthalmic ointment. Patient states it has not helped,   Patient describes when she bends over to pick up something her eye feels like  is going to rupture. Patient also describes her eye looks like is possibly infected and seems like has pus in the nodule.  Patient would like to know if she can be seen

## 2024-01-25 ENCOUNTER — Telehealth (INDEPENDENT_AMBULATORY_CARE_PROVIDER_SITE_OTHER): Payer: Self-pay | Admitting: Internal Medicine

## 2024-01-25 DIAGNOSIS — H00015 Hordeolum externum left lower eyelid: Secondary | ICD-10-CM

## 2024-01-25 MED ORDER — AMOXICILLIN-POT CLAVULANATE 875-125 MG PO TABS: 1.0000 | ORAL_TABLET | Freq: Two times a day (BID) | ORAL | 0 refills | Status: AC

## 2024-01-25 NOTE — Telephone Encounter (Signed)
 Left eye with slight recurrence of hordeolum of lower lid externally, but still with thickening and erythema internally as well. Will treat with Augmentin 875/125 mg twice daily for 7 days Continue warm packs Wash lid margin twice daily with warm water and baby shampoo mix.

## 2024-02-05 ENCOUNTER — Telehealth: Payer: Self-pay

## 2024-02-05 NOTE — Telephone Encounter (Signed)
 Telephoned patient to rescheduled missed appointment. Patient stated she declines [facility] services at this time. [facility]

## 2024-03-03 ENCOUNTER — Ambulatory Visit: Payer: Self-pay | Admitting: Internal Medicine

## 2024-03-03 NOTE — Telephone Encounter (Signed)
 I called and spoke with her on 8/6 after she sent me photos of her eye that appeared to be a hordeolum with some surrounding cellulittis. Discussed warm packing, gentle cleansing of lids with dilute baby shampoo in warm water  Called in course of cephalexin  as well. To call if does not resolve.

## 2024-03-26 ENCOUNTER — Ambulatory Visit (INDEPENDENT_AMBULATORY_CARE_PROVIDER_SITE_OTHER): Payer: Self-pay | Admitting: Internal Medicine

## 2024-03-26 DIAGNOSIS — Z23 Encounter for immunization: Secondary | ICD-10-CM

## 2024-04-14 NOTE — Progress Notes (Signed)
 Hi Dr Adella  I called her, she says she had not take Vit D for two weeks before the labs.  She had gone to [place] and forgot to take her medications. Meanwhile she is taking them  regularly now.

## 2024-09-26 ENCOUNTER — Other Ambulatory Visit: Payer: Self-pay

## 2024-10-01 ENCOUNTER — Encounter: Payer: Self-pay | Admitting: Internal Medicine
